# Patient Record
Sex: Female | Born: 1950 | Race: White | Hispanic: No | Marital: Single | Smoking: Current some day smoker
Health system: Southern US, Community
[De-identification: ages and names within clinical notes are randomized; demographics above are authoritative.]

## PROBLEM LIST (undated history)

## (undated) DIAGNOSIS — E119 Type 2 diabetes mellitus without complications: Secondary | ICD-10-CM

## (undated) DIAGNOSIS — I10 Essential (primary) hypertension: Secondary | ICD-10-CM

---

## 1998-01-20 ENCOUNTER — Other Ambulatory Visit: Admission: RE | Admit: 1998-01-20 | Discharge: 1998-01-20 | Payer: Self-pay | Admitting: Obstetrics and Gynecology

## 1999-12-22 ENCOUNTER — Other Ambulatory Visit: Admission: RE | Admit: 1999-12-22 | Discharge: 1999-12-22 | Payer: Self-pay | Admitting: Obstetrics and Gynecology

## 2005-04-27 ENCOUNTER — Emergency Department: Payer: Self-pay | Admitting: Emergency Medicine

## 2018-06-11 ENCOUNTER — Emergency Department (HOSPITAL_COMMUNITY): Payer: Medicare Other

## 2018-06-11 ENCOUNTER — Encounter (HOSPITAL_COMMUNITY): Payer: Self-pay | Admitting: Emergency Medicine

## 2018-06-11 ENCOUNTER — Inpatient Hospital Stay (HOSPITAL_COMMUNITY): Payer: Medicare Other

## 2018-06-11 ENCOUNTER — Encounter (HOSPITAL_COMMUNITY): Admission: EM | Disposition: E | Payer: Self-pay | Source: Home / Self Care | Attending: Neurology

## 2018-06-11 ENCOUNTER — Emergency Department (HOSPITAL_COMMUNITY): Payer: Medicare Other | Admitting: Certified Registered Nurse Anesthetist

## 2018-06-11 ENCOUNTER — Inpatient Hospital Stay (HOSPITAL_COMMUNITY)
Admission: EM | Admit: 2018-06-11 | Discharge: 2018-06-30 | DRG: 023 | Disposition: E | Payer: Medicare Other | Attending: Pulmonary Disease | Admitting: Pulmonary Disease

## 2018-06-11 ENCOUNTER — Other Ambulatory Visit: Payer: Self-pay

## 2018-06-11 DIAGNOSIS — R471 Dysarthria and anarthria: Secondary | ICD-10-CM | POA: Diagnosis present

## 2018-06-11 DIAGNOSIS — R29723 NIHSS score 23: Secondary | ICD-10-CM | POA: Diagnosis not present

## 2018-06-11 DIAGNOSIS — R1310 Dysphagia, unspecified: Secondary | ICD-10-CM | POA: Diagnosis present

## 2018-06-11 DIAGNOSIS — R29719 NIHSS score 19: Secondary | ICD-10-CM | POA: Diagnosis not present

## 2018-06-11 DIAGNOSIS — E1165 Type 2 diabetes mellitus with hyperglycemia: Secondary | ICD-10-CM | POA: Diagnosis present

## 2018-06-11 DIAGNOSIS — E785 Hyperlipidemia, unspecified: Secondary | ICD-10-CM | POA: Diagnosis present

## 2018-06-11 DIAGNOSIS — F17200 Nicotine dependence, unspecified, uncomplicated: Secondary | ICD-10-CM | POA: Diagnosis present

## 2018-06-11 DIAGNOSIS — G8191 Hemiplegia, unspecified affecting right dominant side: Secondary | ICD-10-CM | POA: Diagnosis present

## 2018-06-11 DIAGNOSIS — J9602 Acute respiratory failure with hypercapnia: Secondary | ICD-10-CM | POA: Diagnosis present

## 2018-06-11 DIAGNOSIS — I10 Essential (primary) hypertension: Secondary | ICD-10-CM | POA: Diagnosis present

## 2018-06-11 DIAGNOSIS — Z0189 Encounter for other specified special examinations: Secondary | ICD-10-CM

## 2018-06-11 DIAGNOSIS — Z515 Encounter for palliative care: Secondary | ICD-10-CM | POA: Diagnosis present

## 2018-06-11 DIAGNOSIS — I161 Hypertensive emergency: Secondary | ICD-10-CM | POA: Diagnosis present

## 2018-06-11 DIAGNOSIS — D72829 Elevated white blood cell count, unspecified: Secondary | ICD-10-CM | POA: Diagnosis present

## 2018-06-11 DIAGNOSIS — I639 Cerebral infarction, unspecified: Secondary | ICD-10-CM | POA: Diagnosis not present

## 2018-06-11 DIAGNOSIS — R32 Unspecified urinary incontinence: Secondary | ICD-10-CM | POA: Diagnosis not present

## 2018-06-11 DIAGNOSIS — J9601 Acute respiratory failure with hypoxia: Secondary | ICD-10-CM

## 2018-06-11 DIAGNOSIS — I619 Nontraumatic intracerebral hemorrhage, unspecified: Secondary | ICD-10-CM | POA: Diagnosis not present

## 2018-06-11 DIAGNOSIS — H53461 Homonymous bilateral field defects, right side: Secondary | ICD-10-CM | POA: Diagnosis present

## 2018-06-11 DIAGNOSIS — H5704 Mydriasis: Secondary | ICD-10-CM | POA: Diagnosis present

## 2018-06-11 DIAGNOSIS — R29706 NIHSS score 6: Secondary | ICD-10-CM | POA: Diagnosis present

## 2018-06-11 DIAGNOSIS — R05 Cough: Secondary | ICD-10-CM

## 2018-06-11 DIAGNOSIS — I63532 Cerebral infarction due to unspecified occlusion or stenosis of left posterior cerebral artery: Principal | ICD-10-CM | POA: Diagnosis present

## 2018-06-11 DIAGNOSIS — N179 Acute kidney failure, unspecified: Secondary | ICD-10-CM | POA: Diagnosis present

## 2018-06-11 DIAGNOSIS — R059 Cough, unspecified: Secondary | ICD-10-CM

## 2018-06-11 DIAGNOSIS — R0689 Other abnormalities of breathing: Secondary | ICD-10-CM

## 2018-06-11 HISTORY — PX: IR CT HEAD LTD: IMG2386

## 2018-06-11 HISTORY — PX: IR ANGIO INTRA EXTRACRAN SEL COM CAROTID INNOMINATE BILAT MOD SED: IMG5360

## 2018-06-11 HISTORY — PX: IR INTRA CRAN STENT: IMG2345

## 2018-06-11 HISTORY — DX: Type 2 diabetes mellitus without complications: E11.9

## 2018-06-11 HISTORY — PX: IR ANGIO VERTEBRAL SEL VERTEBRAL UNI R MOD SED: IMG5368

## 2018-06-11 HISTORY — PX: RADIOLOGY WITH ANESTHESIA: SHX6223

## 2018-06-11 HISTORY — PX: IR PERCUTANEOUS ART THROMBECTOMY/INFUSION INTRACRANIAL INC DIAG ANGIO: IMG6087

## 2018-06-11 HISTORY — DX: Essential (primary) hypertension: I10

## 2018-06-11 LAB — APTT: aPTT: 28 seconds (ref 24–36)

## 2018-06-11 LAB — CBC
HCT: 46.1 % — ABNORMAL HIGH (ref 36.0–46.0)
Hemoglobin: 14.4 g/dL (ref 12.0–15.0)
MCH: 28.6 pg (ref 26.0–34.0)
MCHC: 31.2 g/dL (ref 30.0–36.0)
MCV: 91.7 fL (ref 80.0–100.0)
NRBC: 0 % (ref 0.0–0.2)
PLATELETS: 260 10*3/uL (ref 150–400)
RBC: 5.03 MIL/uL (ref 3.87–5.11)
RDW: 12.7 % (ref 11.5–15.5)
WBC: 7.2 10*3/uL (ref 4.0–10.5)

## 2018-06-11 LAB — COMPREHENSIVE METABOLIC PANEL
ALBUMIN: 3.3 g/dL — AB (ref 3.5–5.0)
ALT: 22 U/L (ref 0–44)
ANION GAP: 9 (ref 5–15)
AST: 21 U/L (ref 15–41)
Alkaline Phosphatase: 66 U/L (ref 38–126)
BILIRUBIN TOTAL: 0.6 mg/dL (ref 0.3–1.2)
BUN: 9 mg/dL (ref 8–23)
CHLORIDE: 98 mmol/L (ref 98–111)
CO2: 23 mmol/L (ref 22–32)
Calcium: 8.8 mg/dL — ABNORMAL LOW (ref 8.9–10.3)
Creatinine, Ser: 0.73 mg/dL (ref 0.44–1.00)
GFR calc Af Amer: >60 mL/min (ref >60)
GFR calc non Af Amer: >60 mL/min (ref >60)
GLUCOSE: 293 mg/dL — AB (ref 70–99)
POTASSIUM: 4.3 mmol/L (ref 3.5–5.1)
Sodium: 130 mmol/L — ABNORMAL LOW (ref 135–145)
TOTAL PROTEIN: 6.7 g/dL (ref 6.5–8.1)

## 2018-06-11 LAB — DIFFERENTIAL
ABS IMMATURE GRANULOCYTES: 0.06 10*3/uL (ref 0.00–0.07)
BASOS PCT: 1 %
Basophils Absolute: 0 10*3/uL (ref 0.0–0.1)
EOS ABS: 0.1 10*3/uL (ref 0.0–0.5)
EOS PCT: 2 %
IMMATURE GRANULOCYTES: 1 %
LYMPHS ABS: 1.6 10*3/uL (ref 0.7–4.0)
Lymphocytes Relative: 22 %
Monocytes Absolute: 0.6 10*3/uL (ref 0.1–1.0)
Monocytes Relative: 9 %
NEUTROS PCT: 65 %
Neutro Abs: 4.8 10*3/uL (ref 1.7–7.7)

## 2018-06-11 LAB — I-STAT CHEM 8, ED
BUN: 10 mg/dL (ref 8–23)
CREATININE: 0.6 mg/dL (ref 0.44–1.00)
Calcium, Ion: 1.13 mmol/L — ABNORMAL LOW (ref 1.15–1.40)
Chloride: 98 mmol/L (ref 98–111)
Glucose, Bld: 287 mg/dL — ABNORMAL HIGH (ref 70–99)
HEMATOCRIT: 46 % (ref 36.0–46.0)
Hemoglobin: 15.6 g/dL — ABNORMAL HIGH (ref 12.0–15.0)
POTASSIUM: 4.3 mmol/L (ref 3.5–5.1)
SODIUM: 132 mmol/L — AB (ref 135–145)
TCO2: 24 mmol/L (ref 22–32)

## 2018-06-11 LAB — GLUCOSE, CAPILLARY
GLUCOSE-CAPILLARY: 300 mg/dL — AB (ref 70–99)
GLUCOSE-CAPILLARY: 282 mg/dL — AB (ref 70–99)
Glucose-Capillary: 257 mg/dL — ABNORMAL HIGH (ref 70–99)
Glucose-Capillary: 331 mg/dL — ABNORMAL HIGH (ref 70–99)

## 2018-06-11 LAB — PROTIME-INR
INR: 0.95
PROTHROMBIN TIME: 12.6 s (ref 11.4–15.2)

## 2018-06-11 LAB — I-STAT TROPONIN, ED: Troponin i, poc: 0 ng/mL (ref 0.00–0.08)

## 2018-06-11 LAB — ETHANOL: Alcohol, Ethyl (B): <10 mg/dL (ref <10)

## 2018-06-11 LAB — TRIGLYCERIDES: Triglycerides: 700 mg/dL — ABNORMAL HIGH (ref <150)

## 2018-06-11 LAB — MRSA PCR SCREENING: MRSA BY PCR: NEGATIVE

## 2018-06-11 SURGERY — IR WITH ANESTHESIA
Anesthesia: General

## 2018-06-11 MED ORDER — FENTANYL CITRATE (PF) 100 MCG/2ML IJ SOLN
50.0000 ug | INTRAMUSCULAR | Status: DC | PRN
  Administered 2018-06-11: 50 ug via INTRAVENOUS
  Filled 2018-06-11 (×2): qty 2

## 2018-06-11 MED ORDER — SUCCINYLCHOLINE CHLORIDE 20 MG/ML IJ SOLN
INTRAMUSCULAR | Status: DC | PRN
  Administered 2018-06-11: 200 mg via INTRAVENOUS

## 2018-06-11 MED ORDER — NITROGLYCERIN 1 MG/10 ML FOR IR/CATH LAB
INTRA_ARTERIAL | Status: AC
  Filled 2018-06-11: qty 10

## 2018-06-11 MED ORDER — PHENYLEPHRINE HCL 10 MG/ML IJ SOLN
INTRAMUSCULAR | Status: DC | PRN
  Administered 2018-06-11: 80 ug via INTRAVENOUS
  Administered 2018-06-11: 120 ug via INTRAVENOUS
  Administered 2018-06-11 (×3): 40 ug via INTRAVENOUS
  Administered 2018-06-11: 80 ug via INTRAVENOUS
  Administered 2018-06-11: 40 ug via INTRAVENOUS

## 2018-06-11 MED ORDER — PANTOPRAZOLE SODIUM 40 MG IV SOLR
40.0000 mg | INTRAVENOUS | Status: DC
  Administered 2018-06-11 – 2018-06-12 (×2): 40 mg via INTRAVENOUS
  Filled 2018-06-11 (×2): qty 40

## 2018-06-11 MED ORDER — TICAGRELOR 90 MG PO TABS: 90.0000 mg | ORAL_TABLET | Freq: Two times a day (BID) | ORAL | Status: DC

## 2018-06-11 MED ORDER — CEFAZOLIN SODIUM-DEXTROSE 1-4 GM/50ML-% IV SOLN
INTRAVENOUS | Status: DC | PRN
  Administered 2018-06-11: 2 g via INTRAVENOUS

## 2018-06-11 MED ORDER — PROPOFOL 500 MG/50ML IV EMUL
INTRAVENOUS | Status: DC | PRN
  Administered 2018-06-11: 50 ug/kg/min via INTRAVENOUS

## 2018-06-11 MED ORDER — FENTANYL BOLUS VIA INFUSION
25.0000 ug | INTRAVENOUS | Status: DC | PRN
  Filled 2018-06-11: qty 25

## 2018-06-11 MED ORDER — TICAGRELOR 90 MG PO TABS
ORAL_TABLET | ORAL | Status: AC
  Filled 2018-06-11: qty 2

## 2018-06-11 MED ORDER — FENTANYL CITRATE (PF) 100 MCG/2ML IJ SOLN
50.0000 ug | INTRAMUSCULAR | Status: DC | PRN
  Administered 2018-06-11: 50 ug via INTRAVENOUS
  Filled 2018-06-11: qty 2

## 2018-06-11 MED ORDER — ROCURONIUM BROMIDE 50 MG/5ML IV SOSY
PREFILLED_SYRINGE | INTRAVENOUS | Status: DC | PRN
  Administered 2018-06-11: 50 mg via INTRAVENOUS
  Administered 2018-06-11 (×2): 10 mg via INTRAVENOUS
  Administered 2018-06-11: 20 mg via INTRAVENOUS
  Administered 2018-06-11: 30 mg via INTRAVENOUS
  Administered 2018-06-11: 50 mg via INTRAVENOUS

## 2018-06-11 MED ORDER — SODIUM CHLORIDE 0.9 % IV SOLN
INTRAVENOUS | Status: DC
  Administered 2018-06-11 – 2018-06-13 (×3): via INTRAVENOUS

## 2018-06-11 MED ORDER — CLEVIDIPINE BUTYRATE 0.5 MG/ML IV EMUL
INTRAVENOUS | Status: AC
  Filled 2018-06-11: qty 50

## 2018-06-11 MED ORDER — SUGAMMADEX SODIUM 200 MG/2ML IV SOLN
INTRAVENOUS | Status: DC | PRN
  Administered 2018-06-11: 300 mg via INTRAVENOUS

## 2018-06-11 MED ORDER — INSULIN ASPART 100 UNIT/ML ~~LOC~~ SOLN
SUBCUTANEOUS | Status: AC
  Filled 2018-06-11: qty 1

## 2018-06-11 MED ORDER — STROKE: EARLY STAGES OF RECOVERY BOOK
Freq: Once | Status: DC
  Filled 2018-06-11 (×2): qty 1

## 2018-06-11 MED ORDER — SODIUM CHLORIDE 0.9 % IV SOLN
INTRAVENOUS | Status: DC
  Administered 2018-06-11 (×3): via INTRAVENOUS

## 2018-06-11 MED ORDER — CLEVIDIPINE BUTYRATE 0.5 MG/ML IV EMUL
0.0000 mg/h | INTRAVENOUS | Status: DC
  Administered 2018-06-11: 19 mg/h via INTRAVENOUS
  Administered 2018-06-11: 21 mg/h via INTRAVENOUS
  Filled 2018-06-11 (×2): qty 50

## 2018-06-11 MED ORDER — ACETAMINOPHEN 160 MG/5ML PO SOLN: 650.0000 mg | ORAL | Status: DC | PRN

## 2018-06-11 MED ORDER — PROPOFOL 1000 MG/100ML IV EMUL
5.0000 ug/kg/min | INTRAVENOUS | Status: DC
  Administered 2018-06-11 (×2): 50 ug/kg/min via INTRAVENOUS
  Filled 2018-06-11 (×2): qty 100

## 2018-06-11 MED ORDER — TICAGRELOR 90 MG PO TABS
90.0000 mg | ORAL_TABLET | Freq: Two times a day (BID) | ORAL | Status: DC
  Administered 2018-06-12 (×2): 90 mg
  Filled 2018-06-11 (×2): qty 1

## 2018-06-11 MED ORDER — MIDAZOLAM HCL 2 MG/2ML IJ SOLN
INTRAMUSCULAR | Status: AC
  Filled 2018-06-11: qty 2

## 2018-06-11 MED ORDER — ONDANSETRON HCL 4 MG/2ML IJ SOLN: 4.0000 mg | Freq: Four times a day (QID) | INTRAMUSCULAR | Status: DC | PRN

## 2018-06-11 MED ORDER — CHLORHEXIDINE GLUCONATE 0.12% ORAL RINSE (MEDLINE KIT)
15.0000 mL | Freq: Two times a day (BID) | OROMUCOSAL | Status: DC
  Administered 2018-06-11 – 2018-06-13 (×4): 15 mL via OROMUCOSAL

## 2018-06-11 MED ORDER — LIDOCAINE HCL 1 % IJ SOLN
INTRAMUSCULAR | Status: AC
  Filled 2018-06-11: qty 20

## 2018-06-11 MED ORDER — PROPOFOL 10 MG/ML IV BOLUS
INTRAVENOUS | Status: DC | PRN
  Administered 2018-06-11: 200 mg via INTRAVENOUS
  Administered 2018-06-11: 100 mg via INTRAVENOUS

## 2018-06-11 MED ORDER — FENTANYL CITRATE (PF) 100 MCG/2ML IJ SOLN: 50.0000 ug | INTRAMUSCULAR | Status: DC | PRN

## 2018-06-11 MED ORDER — CEFAZOLIN SODIUM-DEXTROSE 2-4 GM/100ML-% IV SOLN
INTRAVENOUS | Status: AC
  Filled 2018-06-11: qty 100

## 2018-06-11 MED ORDER — DEXMEDETOMIDINE HCL IN NACL 400 MCG/100ML IV SOLN
0.4000 ug/kg/h | INTRAVENOUS | Status: DC
  Administered 2018-06-11 – 2018-06-12 (×2): 0.5 ug/kg/h via INTRAVENOUS
  Administered 2018-06-13: 0.6 ug/kg/h via INTRAVENOUS
  Filled 2018-06-11 (×3): qty 100

## 2018-06-11 MED ORDER — ONDANSETRON HCL 4 MG/2ML IJ SOLN
INTRAMUSCULAR | Status: DC | PRN
  Administered 2018-06-11: 4 mg via INTRAVENOUS

## 2018-06-11 MED ORDER — FENTANYL CITRATE (PF) 100 MCG/2ML IJ SOLN
INTRAMUSCULAR | Status: AC
  Filled 2018-06-11: qty 2

## 2018-06-11 MED ORDER — IOPAMIDOL (ISOVUE-370) INJECTION 76%
50.0000 mL | Freq: Once | INTRAVENOUS | Status: AC | PRN
  Administered 2018-06-11: 50 mL via INTRAVENOUS

## 2018-06-11 MED ORDER — EPTIFIBATIDE 20 MG/10ML IV SOLN
INTRAVENOUS | Status: AC
  Filled 2018-06-11: qty 10

## 2018-06-11 MED ORDER — TICAGRELOR 60 MG PO TABS
ORAL_TABLET | ORAL | Status: AC | PRN
  Administered 2018-06-11: 180 mg

## 2018-06-11 MED ORDER — ASPIRIN 81 MG PO CHEW
81.0000 mg | CHEWABLE_TABLET | Freq: Every day | ORAL | Status: DC
  Administered 2018-06-12: 81 mg
  Filled 2018-06-11: qty 1

## 2018-06-11 MED ORDER — ORAL CARE MOUTH RINSE
15.0000 mL | OROMUCOSAL | Status: DC
  Administered 2018-06-11 – 2018-06-13 (×15): 15 mL via OROMUCOSAL

## 2018-06-11 MED ORDER — ACETAMINOPHEN 325 MG PO TABS: 650.0000 mg | ORAL_TABLET | ORAL | Status: DC | PRN

## 2018-06-11 MED ORDER — LIDOCAINE HCL (CARDIAC) PF 100 MG/5ML IV SOSY
PREFILLED_SYRINGE | INTRAVENOUS | Status: DC | PRN
  Administered 2018-06-11: 50 mg via INTRAVENOUS

## 2018-06-11 MED ORDER — NITROGLYCERIN 1 MG/10 ML FOR IR/CATH LAB
INTRA_ARTERIAL | Status: AC | PRN
  Administered 2018-06-11 (×7): 25 ug via INTRA_ARTERIAL

## 2018-06-11 MED ORDER — SENNOSIDES-DOCUSATE SODIUM 8.6-50 MG PO TABS: 1.0000 | ORAL_TABLET | Freq: Every evening | ORAL | Status: DC | PRN

## 2018-06-11 MED ORDER — TIROFIBAN HCL IN NACL 5-0.9 MG/100ML-% IV SOLN
INTRAVENOUS | Status: AC
  Filled 2018-06-11: qty 100

## 2018-06-11 MED ORDER — CLEVIDIPINE BUTYRATE 0.5 MG/ML IV EMUL
0.0000 mg/h | INTRAVENOUS | Status: DC
  Administered 2018-06-11: 10 mg/h via INTRAVENOUS
  Administered 2018-06-11: 21 mg/h via INTRAVENOUS
  Filled 2018-06-11: qty 50

## 2018-06-11 MED ORDER — ASPIRIN 81 MG PO CHEW: 81.0000 mg | CHEWABLE_TABLET | Freq: Every day | ORAL | Status: DC

## 2018-06-11 MED ORDER — INSULIN ASPART 100 UNIT/ML ~~LOC~~ SOLN: 8.0000 [IU] | SUBCUTANEOUS | Status: AC

## 2018-06-11 MED ORDER — ASPIRIN 81 MG PO CHEW
CHEWABLE_TABLET | ORAL | Status: AC
  Filled 2018-06-11: qty 1

## 2018-06-11 MED ORDER — INSULIN ASPART 100 UNIT/ML ~~LOC~~ SOLN
0.0000 [IU] | SUBCUTANEOUS | Status: DC
  Administered 2018-06-11 – 2018-06-12 (×2): 8 [IU] via SUBCUTANEOUS
  Administered 2018-06-12: 5 [IU] via SUBCUTANEOUS

## 2018-06-11 MED ORDER — DEXMEDETOMIDINE HCL IN NACL 200 MCG/50ML IV SOLN
0.4000 ug/kg/h | INTRAVENOUS | Status: DC
  Administered 2018-06-11: 0.5 ug/kg/h via INTRAVENOUS
  Filled 2018-06-11: qty 50

## 2018-06-11 MED ORDER — EPTIFIBATIDE 20 MG/10ML IV SOLN
INTRAVENOUS | Status: AC | PRN
  Administered 2018-06-11 (×2): 1.5 mg
  Administered 2018-06-11: 1.5 ug
  Administered 2018-06-11 (×3): 1.5 mg

## 2018-06-11 MED ORDER — ASPIRIN 325 MG PO TABS
ORAL_TABLET | ORAL | Status: AC
  Filled 2018-06-11: qty 1

## 2018-06-11 MED ORDER — CLOPIDOGREL BISULFATE 300 MG PO TABS
ORAL_TABLET | ORAL | Status: AC
  Filled 2018-06-11: qty 1

## 2018-06-11 MED ORDER — FENTANYL CITRATE (PF) 100 MCG/2ML IJ SOLN
50.0000 ug | Freq: Once | INTRAMUSCULAR | Status: AC
  Administered 2018-06-11: 50 ug via INTRAVENOUS

## 2018-06-11 MED ORDER — CLEVIDIPINE BUTYRATE 0.5 MG/ML IV EMUL
INTRAVENOUS | Status: DC | PRN
  Administered 2018-06-11: 4 mg/h via INTRAVENOUS

## 2018-06-11 MED ORDER — NICARDIPINE HCL IN NACL 40-0.83 MG/200ML-% IV SOLN
3.0000 mg/h | INTRAVENOUS | Status: DC
  Administered 2018-06-11: 5 mg/h via INTRAVENOUS
  Administered 2018-06-12: 3 mg/h via INTRAVENOUS
  Administered 2018-06-12: 6 mg/h via INTRAVENOUS
  Administered 2018-06-13: 3 mg/h via INTRAVENOUS
  Filled 2018-06-11 (×4): qty 200

## 2018-06-11 MED ORDER — FENTANYL CITRATE (PF) 100 MCG/2ML IJ SOLN
INTRAMUSCULAR | Status: DC | PRN
  Administered 2018-06-11: 25 ug via INTRAVENOUS
  Administered 2018-06-11 (×2): 50 ug via INTRAVENOUS

## 2018-06-11 MED ORDER — INSULIN ASPART 100 UNIT/ML ~~LOC~~ SOLN
0.0000 [IU] | Freq: Three times a day (TID) | SUBCUTANEOUS | Status: DC
  Administered 2018-06-11: 2 [IU] via SUBCUTANEOUS
  Administered 2018-06-11: 11 [IU] via SUBCUTANEOUS

## 2018-06-11 MED ORDER — ACETAMINOPHEN 650 MG RE SUPP: 650.0000 mg | RECTAL | Status: DC | PRN

## 2018-06-11 MED ORDER — SODIUM CHLORIDE 0.9 % IV SOLN
INTRAVENOUS | Status: DC | PRN
  Administered 2018-06-11: 20 ug/min via INTRAVENOUS

## 2018-06-11 MED ORDER — NITROGLYCERIN 0.2 MG/ML ON CALL CATH LAB
INTRAVENOUS | Status: DC | PRN
  Administered 2018-06-11: 80 ug via INTRAVENOUS
  Administered 2018-06-11: 20 ug via INTRAVENOUS
  Administered 2018-06-11: 40 ug via INTRAVENOUS
  Administered 2018-06-11: 120 ug via INTRAVENOUS

## 2018-06-11 MED ORDER — ASPIRIN 325 MG PO TABS
ORAL_TABLET | ORAL | Status: AC | PRN
  Administered 2018-06-11: 81 mg via ORAL

## 2018-06-11 MED ORDER — IOHEXOL 300 MG/ML  SOLN: 300.0000 mL | Freq: Once | INTRAMUSCULAR | Status: DC | PRN

## 2018-06-11 MED ORDER — LIDOCAINE 2% (20 MG/ML) 5 ML SYRINGE
INTRAMUSCULAR | Status: DC | PRN
  Administered 2018-06-11: 80 mg via INTRAVENOUS

## 2018-06-11 MED ORDER — FENTANYL 2500MCG IN NS 250ML (10MCG/ML) PREMIX INFUSION
25.0000 ug/h | INTRAVENOUS | Status: DC
  Administered 2018-06-11: 100 ug/h via INTRAVENOUS
  Administered 2018-06-13: 200 ug/h via INTRAVENOUS
  Filled 2018-06-11 (×2): qty 250

## 2018-06-11 NOTE — Progress Notes (Signed)
Patient ID: Jessica Hoffman, female   DOB: Jul 26, 1951, 67 y.o.   MRN: [id number] Post procedure patients GA reversed but not extubated. Breathing on herself. Rt pupil 2mm . Lt 4mm both sluggisly reactive. RT groin soft. No hematoma. Distal pulses palpable DPS and PTs bilaterally. Patient to CT brain following D/W neurologist. S.Klever Twyford MD

## 2018-06-11 NOTE — Code Documentation (Signed)
67 yo Caucasian female coming from home with complaints of waking up to right sided weakness and some change in her vision. Pt called EMS and was brought into the ED. EDP and ED RN evaluated patient and activated a Code Stroke. Stroke Team met patient in CT. Initial NIHSS 6 due to right visual field cut, right arm and leg ataxia, decreased sensation of the right, and some mild inattention to the left arm. CT head negative for hemorrhage. CTA/CTP completed and showed an occlusion to her left PCA. Perfusion was noted to have 36 ml of penumbra with no core. Neurologist spoke with [employer] and together they decided to let patient decide whether to move forward with the procedure. MD Rory Percy spoke with the patient and explained the procedure. Patient understood risks and benefits. Decided to continue on with treatment. IR notified and pt taken directly to IR. Arrived in IR at 1129 with pulses marked and completely undressed in hospital gown. Handoff given to [place], Therapist, sports.

## 2018-06-11 NOTE — Progress Notes (Signed)
PCCM INTERVAL EVENT  Triglycerides: 700. Drawn from arterial line. Patient on propofol and clevidipine.  Plan: DC propofol, start precedex infusion and PRN fentanyl.  Transition from clevidipine to nicardipine.   Joneen Roach, AGACNP-BC [facility] Pulmonary/Critical Care Pager [phone] or [phone]  2018-06-12 9:55 PM

## 2018-06-11 NOTE — Procedures (Signed)
S/P bilateral vert artery angiograms and Lt common carotid arteriogram followed by partial recanalization of occluded Lt PCA prox with x 1 pass with 33mm embotrap retriever device achieving a TICI 2b revascularization followed by rescue stenting of reocclusion of the LT PCA  Due to underlying tandem atherosclerotic stenosis . Occlusion within the stent partially recanalized with 9mm of superselective  IA integrelin and x1 balloon angioplasty.

## 2018-06-11 NOTE — Progress Notes (Addendum)
Pt arrived to unit around 1600.  Bedside report given by CRNA.  CCM and neurology paged regarding orders.  New orders received.  RN will continue to monitor.    CCM paged regarding sedation med not working.  New orders received.

## 2018-06-11 NOTE — Anesthesia Procedure Notes (Addendum)
Arterial Line Insertion Start/End08/14/19 11:50 AM Performed by: Dorie RankQuinn, Holly M, CRNA, CRNA  Patient location: OOR procedure area. Preanesthetic checklist: patient identified, IV checked, site marked, risks and benefits discussed, surgical consent, monitors and equipment checked and pre-op evaluation Left, radial was placed Catheter size: 20 G Hand hygiene performed  and maximum sterile barriers used   Attempts: 1 Procedure performed without using ultrasound guided technique. Following insertion, dressing applied and Biopatch. Post procedure assessment: normal  Patient tolerated the procedure well with no immediate complications.

## 2018-06-11 NOTE — Progress Notes (Signed)
Patient ID: Alvina ChouMelinda Parlow, female   DOB: 1951/01/07, 67 y.o.   MRN: [id number] INR. [age] y F Rh  LSW  10pm last night. New symptoms of  And RT sided weakness. And RT momonymous hemianopsia. mRSS of 1 to 2 . CT Brain  NO ICH  CTA occluded Lt PCA prox. CTP penumbra of 36 ml with no core vol  Entire Lt PCA distribution involved. ASPECTS 9 Option  of endovascular revascularization of occluded PCA D/W patient .reasons,risks,alternatives reviewed. Risks of ICH of 10 %,worsening neuro function,death ,vent dependency,vascular injury and inability to revascularize reviewed. Questions answered to patients satisfaction. Informed witnessed consent obtained. S,Abigal Choung MD

## 2018-06-11 NOTE — Progress Notes (Signed)
Post IR CT - no bleed. Stent placed mid basilar to P2 stent. Left pupil 5mm, non reactive, Rt 3mm sluggish Withdrawal to pain all except Rt arm.  Remains intubated.  Will follow.  -- Milon DikesAshish Kylei Purington, MD [facility] Pager: [phone] If 7pm to 7am, please call on call as listed on AMION.

## 2018-06-11 NOTE — Consult Note (Signed)
NAME:  Xayla Puzio, MRN:  [id number], DOB:  Sep 17, 1950, LOS: 0 ADMISSION DATE:  06-27-18, CONSULTATION DATE:  Jun 27, 2018 REFERRING MD:  Corliss Skains  CHIEF COMPLAINT:  AMS   Brief History   Matricia Begnaud is a 67 y.o. female who was admitted 11/12 with left PCA infarct.  Taken to IR for intervention then returned to the ICU on the ventilator.  History of present illness   Tatiana Courter is a 67 y.o. female who has a PMH as outlined below (see "past medical history").  She presented to [facility] ED 11/12 with right sided numbness.  She was last seen normal at 10pm the night prior before going to bed.  In ED, she was hypertensive at 230/120.  She had right sided deficits; therefore, code stroke was activated.  She had CT that showed early changes c/w acute / subacute left PCA area infarct.  CTA demonstrated left P1 segment occlusion.  CT perfusion did not show any core infarct; therefore, she was taken to IR where she had b/l verterbral artery and L common carotid artery angiograms followed by partial recanalization of occluded left PCA and rescue stenting of reocclusion to the left PCA.  Past Medical History  HTN, DM.  Significant Hospital Events   11/12 > admit.  Consults:  PCCM 11/12  Procedures:  ETT 11/12 >  IR 11/12 > b/l verterbral artery and L common carotid artery angiograms followed by partial recanalization of occluded left PCA and rescue stenting of reocclusion to the left PCA.  Significant Diagnostic Tests:  CT head 11/12 > left PCA infarct. CTA head / CT cerebral perfusion 11/12 > left PCA occlusion, occlusion or high grade stenosis of right vertebral artery, mild to mod right PCA atherosclerosis and stenosis, b/l carotid atherosclerosis. MRI brain 11/13 >  Echo 11/13 >   Micro Data:  None.  Antimicrobials:  None.   Interim history/subjective:  On vent, comfortable.  Objective:  Blood pressure (!) 155/60, pulse 94, temperature 97.7 F (36.5 C), temperature source Oral,  resp. rate (!) 22, height 5\' 7"  (1.702 m), weight 122.5 kg, SpO2 90 %.    Vent Mode: PRVC FiO2 (%):  [40 %] 40 % Set Rate:  [14 bmp] 14 bmp Vt Set:  [490 mL] 490 mL PEEP:  [5 cmH20] 5 cmH20 Plateau Pressure:  [21 cmH20] 21 cmH20   Intake/Output Summary (Last 24 hours) at June 27, 2018 1705 Last data filed at 2018/06/27 1517 Gross per 24 hour  Intake 2000 ml  Output 25 ml  Net 1975 ml   Filed Weights   06/27/2018 1031  Weight: 122.5 kg    Examination: General: Adult female, in NAD. Neuro: Sedated, unresponsive. HEENT: [place]/AT. Sclerae anicteric.  Left pupil dilated > right and sluggish. Cardiovascular: RRR, no M/R/G.  Lungs: Respirations even and unlabored.  CTA bilaterally, No W/R/R.  Abdomen: Obese, BS x 4, soft, NT/ND.  Musculoskeletal: No gross deformities, no edema.  Skin: Intact, warm, no rashes.  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 - Post op care per IR. - Stroke workup / management per neuro.  Respiratory insufficiency - due to above. - Continue vent support overnight. - SBT in AM for hopeful extubation.  Hx HTN. - Continue cleviprex, goal SBP < 140 with acute infarct. - May need to have a regimen started for long term management along with PCP follow up.  Hx DM. - SSI.  Rest per primary team.   Best Practice:  Diet: NPO. Pain/Anxiety/Delirium protocol (if  indicated): Precedex gtt / fentanyl PRN.  RASS goal 0 to -1. VAP protocol (if indicated): In place. DVT prophylaxis: SCD's. GI prophylaxis: Pantoprazole. Glucose control: SSI. Mobility: Bedrest. Code Status: Full. Family Communication: None available. Disposition: ICU.  Labs   CBC: Recent Labs  Lab 10-09-17 1045 10-09-17 1047  WBC 7.2  --   NEUTROABS 4.8  --   HGB 14.4 15.6*  HCT 46.1* 46.0  MCV 91.7  --   PLT 260  --    Basic Metabolic Panel: Recent Labs  Lab 10-09-17 1045 10-09-17 1047  NA 130* 132*  K 4.3 4.3  CL 98 98  CO2 23  --   GLUCOSE 293* 287*    BUN 9 10  CREATININE 0.73 0.60  CALCIUM 8.8*  --    GFR: Estimated Creatinine Clearance: 90.1 mL/min (by C-G formula based on SCr of 0.6 mg/dL). Recent Labs  Lab 10-09-17 1045  WBC 7.2   Liver Function Tests: Recent Labs  Lab 10-09-17 1045  AST 21  ALT 22  ALKPHOS 66  BILITOT 0.6  PROT 6.7  ALBUMIN 3.3*   No results for input(s): LIPASE, AMYLASE in the last 168 hours. No results for input(s): AMMONIA in the last 168 hours. ABG    Component Value Date/Time   TCO2 24 2017-11-25 1047    Coagulation Profile: Recent Labs  Lab 10-09-17 1045  INR 0.95   Cardiac Enzymes: No results for input(s): CKTOTAL, CKMB, CKMBINDEX, TROPONINI in the last 168 hours. HbA1C: No results found for: HGBA1C CBG: No results for input(s): GLUCAP in the last 168 hours.  Review of Systems:   Unable to obtain as pt is encephalopathic.  Past medical history  She,  has a past medical history of Diabetes mellitus without complication (HCC) and Hypertension.   Surgical History   History reviewed. No pertinent surgical history.   Social History   reports that she has been smoking. She has never used smokeless tobacco. She reports that she drank alcohol. She reports that she does not use drugs.   Family history   Her family history is not on file.   Allergies No Known Allergies   Home meds  Prior to Admission medications   Not on File    Critical care time: 35 min.    Rutherford Guysahul Arbutus Nelligan, [place] - [facility] [place] [facility] Pager: [phone]  or [phone] 2017-11-25, 5:05 PM

## 2018-06-11 NOTE — Anesthesia Postprocedure Evaluation (Signed)
Anesthesia Post Note  Patient: Jessica Hoffman  Procedure(Hoffman) Performed: IR WITH ANESTHESIA (N/A )     Patient location during evaluation: NICU Anesthesia Type: General Level of consciousness: sedated Pain management: pain level controlled Vital Signs Assessment: post-procedure vital signs reviewed and stable Respiratory status: patient remains intubated per anesthesia plan Cardiovascular status: stable Postop Assessment: no apparent nausea or vomiting Anesthetic complications: no    Last Vitals:  Vitals:   06/14/2018 1031 06/02/2018 1550  BP: (!) 230/88   Pulse: 68   Resp: 15   Temp: 36.5 C   SpO2: 100% 100%    Last Pain:  Vitals:   06/26/2018 1031  TempSrc: Oral  PainSc: 0-No pain                 Jessica Hoffman

## 2018-06-11 NOTE — ED Triage Notes (Signed)
Pt in from home via [facility] with R side numbness since 10pm last night. Speech clear, vision is more blurry R side. R arm drift present. Fire dept reports initial BP of 280/120, CBG 310. A&ox4

## 2018-06-11 NOTE — Transfer of Care (Signed)
Immediate Anesthesia Transfer of Care Note  Patient: Jessica ChouMelinda Hoffman  Procedure(s) Performed: IR WITH ANESTHESIA (N/A )  Patient Location: NICU  Anesthesia Type:General  Level of Consciousness: Patient remains intubated per anesthesia plan  Airway & Oxygen Therapy: Patient remains intubated per anesthesia plan and Patient placed on Ventilator (see vital sign flow sheet for setting)  Post-op Assessment: Report given to RN and Post -op Vital signs reviewed and stable  Post vital signs: Reviewed and stable  Last Vitals:  Vitals Value Taken Time  BP 155/60 06/26/2018  4:00 PM  Temp    Pulse 91 06/10/2018  4:05 PM  Resp 19 06/07/2018  4:05 PM  SpO2 91 % 06/06/2018  4:05 PM  Vitals shown include unvalidated device data.  Last Pain:  Vitals:   06/07/2018 1031  TempSrc: Oral  PainSc: 0-No pain         Complications: No apparent anesthesia complications

## 2018-06-11 NOTE — ED Provider Notes (Signed)
[facility] EMERGENCY DEPARTMENT Provider Note   CSN: [id number] Arrival date & time: 06/14/18  1020   An emergency department physician performed an initial assessment on this suspected stroke patient at 1020.  History   Chief Complaint Chief Complaint  Patient presents with  . R side numbness  . Hypertension    HPI Jessica Hoffman is a 67 y.o. female.  The history is provided by the patient and the EMS personnel. No language interpreter was used.  Hypertension    Jessica Hoffman is a 67 y.o. female who presents to the Emergency Department complaining of right sided numbness. He has a history of hypertension and diabetes and presents to the emergency department by EMS for evaluation of right sided numbness. Symptoms started after she went to bed at night. She went to bed between 10 and 11 and when she awoke in the middle the night she noted that she was numb to the right face, arm and leg. When she woke this morning she has ongoing numbness and was concerned for stroke. She took five baby aspirin and called 911. No prior similar symptoms. She denies any recent illnesses or medication changes. No prior similar symptoms. Past Medical History:  Diagnosis Date  . Diabetes mellitus without complication (HCC)   . Hypertension     There are no active problems to display for this patient.   History reviewed. No pertinent surgical history.   OB History   None      Home Medications    Prior to Admission medications   Not on File    Family History No family history on file.  Social History Social History   Tobacco Use  . Smoking status: Current Some Day Smoker  . Smokeless tobacco: Never Used  Substance Use Topics  . Alcohol use: Not Currently  . Drug use: Never     Allergies   Patient has no allergy information on record.   Review of Systems Review of Systems  All other systems reviewed and are negative.    Physical Exam Updated Vital  Signs BP (!) 230/88 (BP Location: Right Arm)   Pulse 68   Temp 97.7 F (36.5 C) (Oral)   Resp 15   Ht 5\' 5"  (1.651 m)   Wt 122.5 kg   SpO2 100%   BMI 44.93 kg/m   Physical Exam  Constitutional: She is oriented to person, place, and time. She appears well-developed and well-nourished.  HENT:  Head: Normocephalic and atraumatic.  Cardiovascular: Normal rate and regular rhythm.  No murmur heard. Pulmonary/Chest: Effort normal and breath sounds normal. No respiratory distress.  Abdominal: Soft. There is no tenderness. There is no rebound and no guarding.  Musculoskeletal: She exhibits no tenderness.  2+ DP pulses bilaterally. Non pitting edema to bilateral lower extremities.  Neurological: She is alert and oriented to person, place, and time.  No asymmetry of facial muscles. Altered sensation to light touch of her right face, arm and leg. Fluent speech. Right-sided visual field cuts. Right upper extremity drift. 4/5 strength in the right upper extremity, 4/5 strength in the right lower extremity.  Skin: Skin is warm and dry.  Psychiatric: She has a normal mood and affect. Her behavior is normal.  Nursing note and vitals reviewed.    ED Treatments / Results  Labs (all labs ordered are listed, but only abnormal results are displayed) Labs Reviewed  CBC - Abnormal; Notable for the following components:      Result Value  HCT 46.1 (*)    All other components within normal limits  I-STAT CHEM 8, ED - Abnormal; Notable for the following components:   Sodium 132 (*)    Glucose, Bld 287 (*)    Calcium, Ion 1.13 (*)    Hemoglobin 15.6 (*)    All other components within normal limits  PROTIME-INR  APTT  DIFFERENTIAL  ETHANOL  COMPREHENSIVE METABOLIC PANEL  RAPID URINE DRUG SCREEN, HOSP PERFORMED  URINALYSIS, ROUTINE W REFLEX MICROSCOPIC  I-STAT TROPONIN, ED    EKG EKG Interpretation  Date/Time:  Tuesday June 11 2018 10:28:16 EST Ventricular Rate:  68 PR Interval:      QRS Duration: 92 QT Interval:  426 QTC Calculation: 454 R Axis:   -16 Text Interpretation:  Sinus rhythm Borderline left axis deviation Borderline T wave abnormalities Confirmed by Tilden Fossaees, Delayni Streed [phone]([id number]) on August 16, 2017 11:17:54 AM   Radiology Ct Head Code Stroke Wo Contrast  Result Date: August 16, 2017 CLINICAL DATA:  Code stroke. 67 year old female with right side weakness. EXAM: CT HEAD WITHOUT CONTRAST TECHNIQUE: Contiguous axial images were obtained from the base of the skull through the vertex without intravenous contrast. COMPARISON:  None. FINDINGS: Brain: Frontal lobe volume loss bilaterally (coronal image 30). Lacunar infarcts in the left corona radiata and right caudate appear chronic. Age indeterminate left lateral thalamus lacune (series 3, image 18). Additionally there is a 3 centimeter area of cytotoxic edema in the left occipital lobe (also on image 18). No associated hemorrhage or mass effect. No other cytotoxic edema identified. No midline shift, ventriculomegaly, mass effect, evidence of mass lesion, intracranial hemorrhage. No chronic cortical encephalomalacia identified. Vascular: Calcified atherosclerosis at the skull base. Skull: Negative, hyperostosis (normal variant). Sinuses/Orbits: Visualized paranasal sinuses and mastoids are well pneumatized. Other: Visualized orbit soft tissues are within normal limits. No acute scalp soft tissue findings. ASPECTS [facility]([place] Stroke Program Early CT Score) - Ganglionic level infarction (caudate, lentiform nuclei, internal capsule, insula, M1-M3 cortex): 7 - Supraganglionic infarction (M4-M6 cortex): 3 Total score (0-10 with 10 being normal): 10 (left PCA territory abnormalities). IMPRESSION: 1. Acute to subacute appearing left PCA territory infarct with probable involvement of the left thalamus in this clinical setting. No associated hemorrhage or mass effect. 2. 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  3. The nonspecific bilateral frontal lobe atrophy. 4. ASPECTS is 10. 5. These results were communicated to Dr. Wilford CornerArora at 10:57 amon January 17, 2019by text page via the [facility] messaging system. Electronically Signed   By: Odessa FlemingH  Hall M.D.   On: August 16, 2017 10:58    Procedures Procedures (including critical care time) CRITICAL CARE Performed by: Tilden FossaElizabeth Josette Shimabukuro   Total critical care time: 35 minutes  Critical care time was exclusive of separately billable procedures and treating other patients.  Critical care was necessary to treat or prevent imminent or life-threatening deterioration.  Critical care was time spent personally by me on the following activities: development of treatment plan with patient and/or surrogate as well as nursing, discussions with consultants, evaluation of patient's response to treatment, examination of patient, obtaining history from patient or surrogate, ordering and performing treatments and interventions, ordering and review of laboratory studies, ordering and review of radiographic studies, pulse oximetry and re-evaluation of patient's condition. Medications Ordered in ED Medications  iopamidol (ISOVUE-370) 76 % injection 50 mL (50 mLs Intravenous Contrast Given 06/10/18 1109)     Initial Impression / Assessment and Plan / ED Course  I have reviewed the triage vital signs and the  nursing notes.  Pertinent labs & imaging results that were available during my care of the patient were reviewed by me and considered in my medical decision making (see chart for details).     Patient here for evaluation of right arm and leg numbness that began late last night/ early this morning. Patient is LVL positive with right-sided visual field cut as well as right upper extremity drift and altered sensation. Code stroke was activated on ED arrival. She was evaluated promptly by neurology. CTA is positive for acute occlusion, patient transferred to interventional radiology for emergent  intervention. She is not a TPA candidate due to duration of symptoms. Final Clinical Impressions(s) / ED Diagnoses   Final diagnoses:  Acute CVA (cerebrovascular accident) [facility])    ED Discharge Orders    None       Tilden Fossa, MD 07-02-2018 [phone]

## 2018-06-11 NOTE — Anesthesia Procedure Notes (Signed)
Procedure Name: Intubation Date/Time: 06/15/2018 11:45 AM Performed by: Lavell Luster, CRNA Pre-anesthesia Checklist: Patient identified, Emergency Drugs available, Suction available, Patient being monitored and Timeout performed Patient Re-evaluated:Patient Re-evaluated prior to induction Oxygen Delivery Method: Circle system utilized Preoxygenation: Pre-oxygenation with 100% oxygen Induction Type: IV induction Ventilation: Mask ventilation without difficulty and Oral airway inserted - appropriate to patient size Laryngoscope Size: Mac and 3 Grade View: Grade III Tube type: Subglottic suction tube Laser Tube: Cuffed inflated with minimal occlusive pressure - saline Tube size: 7.5 mm Number of attempts: 2 Airway Equipment and Method: Stylet and Video-laryngoscopy Placement Confirmation: ETT inserted through vocal cords under direct vision,  positive ETCO2 and breath sounds checked- equal and bilateral Secured at: 22 cm Tube secured with: Tape Dental Injury: Teeth and Oropharynx as per pre-operative assessment  Difficulty Due To: Difficulty was unanticipated, Difficult Airway- due to limited oral opening and Difficult Airway- due to reduced neck mobility Future Recommendations: Recommend- induction with short-acting agent, and alternative techniques readily available

## 2018-06-11 NOTE — Anesthesia Preprocedure Evaluation (Addendum)
Anesthesia Evaluation  Patient identified by MRN, date of birth, ID band Patient confused    Reviewed: Allergy & Precautions, NPO status , Patient's Chart, lab work & pertinent test resultsPreop documentation limited or incomplete due to emergent nature of procedure.  Airway Mallampati: II  TM Distance: >3 FB Neck ROM: Full    Dental no notable dental hx. (+) Poor Dentition   Pulmonary Current Smoker,    Pulmonary exam normal breath sounds clear to auscultation       Cardiovascular hypertension, Normal cardiovascular exam Rhythm:Regular Rate:Normal     Neuro/Psych CVA negative psych ROS   GI/Hepatic negative GI ROS, Neg liver ROS,   Endo/Other  diabetes, Type 2  Renal/GU negative Renal ROS     Musculoskeletal negative musculoskeletal ROS (+)   Abdominal   Peds  Hematology negative hematology ROS (+)   Anesthesia Other Findings Code stroke, pt following commands but confused  Reproductive/Obstetrics                            Anesthesia Physical Anesthesia Plan  ASA: III and emergent  Anesthesia Plan: General   Post-op Pain Management:    Induction: Intravenous, Cricoid pressure planned and Rapid sequence  PONV Risk Score and Plan: 2 and Treatment may vary due to age or medical condition  Airway Management Planned: Oral ETT  Additional Equipment: Arterial line  Intra-op Plan:   Post-operative Plan: Extubation in OR  Informed Consent: I have reviewed the patients History and Physical, chart, labs and discussed the procedure including the risks, benefits and alternatives for the proposed anesthesia with the patient or authorized representative who has indicated his/her understanding and acceptance.   Dental advisory given  Plan Discussed with: CRNA  Anesthesia Plan Comments:         Anesthesia Quick Evaluation

## 2018-06-11 NOTE — Consult Note (Deleted)
Neurology Consultation  Reason for Consult: Code Stroke Referring Physician: Dr. Madilyn Hook  CC: Right sided numbness  History is obtained from: Patient  HPI: Jessica Hoffman is a 67 y.o. female with pmhx of HTN, and diabetes presenting from home with complaint of right sided numbness. Patient's last known normal was 10 pm last night before going to bed. She called EMS this morning after waking with right sided numbness. At baseline, she is able to care for herself independently and perform all ADLs. She lives alone. On arrival to the ED she was hypertensive 230/120. ED provider noted right visual field defect, right upper extremity weakness, and right arm pronator drift and code stroke was activated. CT head showed early changes consistent with acute / subacute left PCA area infarct with possible involvement of the left thalamus. CTA head was significant for a left P1 segment occlusion. Follow up CT perfusion was without infarct core. Risks and benefits of IR intervention with discussed with the patient. With the early changes on CT she is likely at increased risk for hemorrhage. Patient understood this risk and opted to proceed with intervention.    LKW: 11/11 22:00 tpa given?: no Premorbid modified Rankin scale (mRS):  0-Completely asymptomatic and back to baseline post-stroke 1-No significant post stroke disability and can perform usual duties with stroke symptoms 2-Slight disability-UNABLE to perform all activities but does not need assistance  3-Moderate disability-requires help but walks WITHOUT assistance 4-Needs assistance to walk and tend to bodily needs 5-Severe disability-bedridden, incontinent, needs constant attention 6- Death   ROS: A 14 point ROS was performed and is negative except as noted in the HPI.   Past Medical History:  Diagnosis Date  . Diabetes mellitus without complication (HCC)   . Hypertension    No family history on file.   Social History:   reports that she  has been smoking. She has never used smokeless tobacco. She reports that she drank alcohol. She reports that she does not use drugs.  Medications No current facility-administered medications for this encounter.  No current outpatient medications on file.  Exam: Current vital signs: BP (!) 230/88 (BP Location: Right Arm)   Pulse 68   Temp 97.7 F (36.5 C) (Oral)   Resp 15   Ht 5\' 5"  (1.651 m)   Wt 122.5 kg   SpO2 100%   BMI 44.93 kg/m  Vital signs in last 24 hours: Temp:  [97.7 F (36.5 C)] 97.7 F (36.5 C) (11/12 1031) Pulse Rate:  [66-68] 68 (11/12 1031) Resp:  [13-18] 15 (11/12 1031) BP: (198-230)/(88-108) 230/88 (11/12 1031) SpO2:  [99 %-100 %] 100 % (11/12 1031) Weight:  [122.5 kg] 122.5 kg (11/12 1031)  GENERAL: Awake, alert in NAD HEENT: - Normocephalic and atraumatic, dry mm, no LN++, no Thyromegally LUNGS - Breathing comfortably on RA, normal respiratory rate ABDOMEN - Soft, nontender, nondistended with normoactive BS Ext: warm, well perfused, intact peripheral pulses, __ edema  NEURO:  Mental Status: AA&Ox3  Language: speech is dysarthric.  Naming, repetition, fluency, and comprehension intact. Cranial Nerves: PERRL2 mm/brisk. EOMI, complete right hemianopsia, no facial asymmetry,facial sensation intact, hearing intact, tongue/uvula/soft palate midline, normal sternocleidomastoid and trapezius muscle strength. No evidence of tongue atrophy or fibrillations Motor: Decrease right upper extremity 4/5, 5/5 left upper extremity and lower extremities bilaterally. Right upper extremity pronator drift.   Tone: is normal and bulk is normal Sensation- Decreased sensation to light touch on the right upper extremity  Coordination: Right dysmetria  Gait- deferred  NIHSS:  6   Labs I have reviewed labs in epic and the results pertinent to this consultation are:  CBC    Component Value Date/Time   WBC 7.2 Jul 11, 2018 1045   RBC 5.03 07/11/2018 1045   HGB 15.6 (H)  07-11-2018 1047   HCT 46.0 07-11-18 1047   PLT 260 2018/07/11 1045   MCV 91.7 07-11-18 1045   MCH 28.6 Jul 11, 2018 1045   MCHC 31.2 07-11-18 1045   RDW 12.7 07-11-18 1045   LYMPHSABS 1.6 07-11-18 1045   MONOABS 0.6 07-11-2018 1045   EOSABS 0.1 07-11-18 1045   BASOSABS 0.0 2018-07-11 1045    CMP     Component Value Date/Time   NA 132 (L) 07/11/2018 1047   K 4.3 07-11-18 1047   CL 98 Jul 11, 2018 1047   GLUCOSE 287 (H) 07/11/2018 1047   BUN 10 2018-07-11 1047   CREATININE 0.60 07-11-2018 1047    Lipid Panel  No results found for: CHOL, TRIG, HDL, CHOLHDL, VLDL, LDLCALC, LDLDIRECT   Imaging I have reviewed the images obtained:  CT-scan of the brain: - Acute / subacute left PCA area infarct with possible involvement of the left thalamus  CTA:  - Left P1 segment occlusion  CT Perfusion: - No infarct core  MRI examination of the brain - pending  Assessment: 67 yo F with pmhx of HTN and diabetes who presented with complaint of right sided numbness. Found to have right hemianopsia, right sided weakness, and dysarthria on ED arrival. Last known normal was 10 pm yesterday evening.  Code stroke was activated. CT head with early changes consistent with acute / subacute left PCA infarct. CTA demonstrated P1 occlusion with no infarct core on CT perfusion. Risks & benefits of IR intervention were discussed with the patient including the possible increased risk of bleed with early changes seen on CT. Patient agreed to proceed with intervention.   Impression: Acute left PCA p1 segment infarct, likely atherosclerotic   Recommendations: -- Appreciate IR assistance -- BP control; goal to normal after revascularization, SBP <140  -- MRI brain -- A1c, lipid panel -- ASA 325 -- Atorvastatin  -- Risk factor modification  -- Telemetry  -- PT / OT / speech   Reymundo Poll, M.D. - PGY3 Jul 11, 2018, 11:22 AM

## 2018-06-11 NOTE — H&P (Addendum)
Neurology H&P  Reason for Consult: Code Stroke Referring Physician: Dr. Madilyn Hook  CC: Right sided numbness  History is obtained from: Patient  HPI: Jessica Hoffman is a 67 y.o. female with pmhx of HTN, and diabetes presenting from home with complaint of right sided numbness. Patient's last known normal was 10 pm last night (06/10/18) before going to bed. She called EMS this morning after waking with right sided numbness. At baseline, she is able to care for herself independently and perform all ADLs. She lives alone. On arrival to the ED she was hypertensive 230/120. ED provider noted right visual field defect, right upper extremity weakness, and right arm pronator drift and code stroke was activated. CT head showed early changes consistent with acute / subacute left PCA area infarct with possible involvement of the left thalamus. CTA head was significant for a left P1 segment occlusion. Follow up CT perfusion was without infarct core. Risks and benefits of IR intervention with discussed with the patient. With the early changes on CT she is likely at increased risk for hemorrhage. Patient understood this risk and opted to proceed with intervention.    LKW: 06/10/18@ 22:00 tpa given?: no, OSW Premorbid modified Rankin scale (mRS): 2  ROS:ROS was performed and is negative except as noted in the HPI.   Past Medical History:  Diagnosis Date  . Diabetes mellitus without complication (HCC)   . Hypertension    No family history on file.   Social History:   reports that she has been smoking. She has never used smokeless tobacco. She reports that she drank alcohol. She reports that she does not use drugs.  Medications    Current Facility-Administered Medications:  .   stroke: mapping our early stages of recovery book, , Does not apply, Once, Milon Dikes, MD .  0.9 %  sodium chloride infusion, , Intravenous, Continuous, Milon Dikes, MD .  acetaminophen (TYLENOL) tablet 650 mg, 650 mg, Oral,  Q4H PRN **OR** acetaminophen (TYLENOL) solution 650 mg, 650 mg, Per Tube, Q4H PRN **OR** acetaminophen (TYLENOL) suppository 650 mg, 650 mg, Rectal, Q4H PRN, Milon Dikes, MD .  aspirin 325 MG tablet, , , ,  .  ceFAZolin (ANCEF) 2-4 GM/100ML-% IVPB, , , ,  .  clevidipine (CLEVIPREX) 0.5 MG/ML infusion, , , ,  .  clopidogrel (PLAVIX) 300 MG tablet, , , ,  .  eptifibatide (INTEGRILIN) 20 MG/10ML injection, , , ,  .  fentaNYL (SUBLIMAZE) 100 MCG/2ML injection, , , ,  .  midazolam (VERSED) 2 MG/2ML injection, , , ,  .  nitroGLYCERIN 100 mcg/mL intra-arterial injection, , , ,  .  senna-docusate (Senokot-S) tablet 1 tablet, 1 tablet, Oral, QHS PRN, Milon Dikes, MD .  ticagrelor (BRILINTA) 90 MG tablet, , , ,  .  tirofiban (AGGRASTAT) 5-0.9 MG/100ML-% injection, , , ,  No current outpatient medications on file.  Facility-Administered Medications Ordered in Other Encounters:  .  ceFAZolin (ANCEF) IVPB 1 g/50 mL premix, , Intravenous, Anesthesia Intra-op, Epifanio Lesches, CRNA, 2 g at 07-08-2018 1158 .  fentaNYL (SUBLIMAZE) injection, , , Anesthesia Intra-op, Epifanio Lesches, CRNA, 50 mcg at 07-08-2018 1141 .  lidocaine (cardiac) 100 mg/37mL (XYLOCAINE) injection 2%, , , Anesthesia Intra-op, Epifanio Lesches, CRNA, 50 mg at 07/08/2018 1140 .  lidocaine 2% (20 mg/mL) 5 mL syringe, , Intravenous, Anesthesia Intra-op, Epifanio Lesches, CRNA, 80 mg at July 08, 2018 1140 .  propofol (DIPRIVAN) 10 mg/mL bolus/IV push, , , Anesthesia Intra-op, Epifanio Lesches, CRNA,  100 mg at 06/03/2018 1144 .  rocuronium bromide 10 mg/mL (PF) syringe (5mL), , Intravenous, Anesthesia Intra-op, Epifanio Lesches, CRNA, 50 mg at 06/15/2018 1147 .  succinylcholine (ANECTINE) injection, , , Anesthesia Intra-op, Epifanio Lesches, CRNA, 200 mg at 05/31/2018 1140   Exam: Current vital signs: BP (!) 230/88 (BP Location: Right Arm)   Pulse 68   Temp 97.7 F (36.5 C) (Oral)   Resp 15   Ht 5\' 5"  (1.651 m)   Wt 122.5 kg   SpO2 100%   BMI 44.93 kg/m   Vital signs in last 24 hours: Temp:  [97.7 F (36.5 C)] 97.7 F (36.5 C) (11/12 1031) Pulse Rate:  [66-68] 68 (11/12 1031) Resp:  [13-18] 15 (11/12 1031) BP: (198-230)/(88-108) 230/88 (11/12 1031) SpO2:  [99 %-100 %] 100 % (11/12 1031) Weight:  [122.5 kg] 122.5 kg (11/12 1031) GENERAL: Awake, alert in NAD HEENT: - Normocephalic and atraumatic, dry mm, no LN++, no Thyromegally LUNGS - Breathing comfortably on RA, normal respiratory rate ABDOMEN - Soft, nontender, nondistended with normoactive BS Ext: warm, well perfused, intact peripheral pulses, trace edema bilaterally  NEURO:  Mental Status: AA&Ox3  Language: speech is dysarthric.  Naming, repetition, fluency, and comprehension intact. Cranial Nerves: PERRL2 mm/brisk. EOMI,  right homonymous hemianopsia, no facial asymmetry,facial sensation intact, hearing intact, tongue/uvula/soft palate midline, normal sternocleidomastoid and trapezius muscle strength. No evidence of tongue atrophy or fibrillations Motor: Decrease right upper extremity 4/5, 5/5 left upper extremity and lower extremities bilaterally. Right upper extremity pronator drif also noted .   Tone: is normal and bulk is normal Sensation- Decreased sensation to light touch on the right upper extremity  Coordination: Right dysmetria, disproportionate to weakness.  Other extremities normal. Gait- deferred  NIHSS: 6  Labs I have reviewed labs in epic and the results pertinent to this consultation are:  CBC    Component Value Date/Time   WBC 7.2 05/31/2018 1045   RBC 5.03 06/12/2018 1045   HGB 15.6 (H) 06/18/2018 1047   HCT 46.0 06/14/2018 1047   PLT 260 06/23/2018 1045   MCV 91.7 06/23/2018 1045   MCH 28.6 06/23/2018 1045   MCHC 31.2 06/20/2018 1045   RDW 12.7 06/07/2018 1045   LYMPHSABS 1.6 06/17/2018 1045   MONOABS 0.6 06/28/2018 1045   EOSABS 0.1 06/14/2018 1045   BASOSABS 0.0 06/12/2018 1045    CMP     Component Value Date/Time   NA 132 (L) 06/29/2018  1047   K 4.3 06/28/2018 1047   CL 98 06/26/2018 1047   GLUCOSE 287 (H) 06/09/2018 1047   BUN 10 06/03/2018 1047   CREATININE 0.60 06/12/2018 1047   Imaging I have reviewed the images obtained: CT-scan of the brain: - Acute / subacute left PCA area infarct with possible involvement of the left thalamus, right vertebral artery origin occlusion.  Mild to moderate right PCA atherosclerosis and stenosis, P1 junction.  Bilateral carotid atherosclerosis with moderate left ICA siphon stenosis but no stenosis in the neck.  CTA:  - Left P1 segment occlusion CT Perfusion: - No core, penumbra of 36 cc.  MRI examination of the brain - pending  Assessment: 67 yo F with pmhx of HTN and diabetes who presented with complaint of right sided numbness. Found to have right hemianopsia, right sided weakness, decreased sensation on the right and dysarthria on ED arrival. Last known normal was 10 pm yesterday evening.  Code stroke was activated.  CT head with early changes of stroke in the left  PCA territory.  CTA demonstrated P1 occlusion with no core on CT perfusion, and a 36 mL ischemic penumbra.  Outside the window for IV TPA. Risks & benefits of IR intervention were discussed with the patient including the possible increased risk of bleed with early changes seen on CT. Patient agreed to proceed with intervention.   Impression: Acute left PCA p1 segment infarct, likely atheroembolic- artery to artery embolization versus cardio embolic.  Reymundo Poll, M.D. - PGY3 06/10/2018, 11:22 AM   Attending addendum Patient seen and examined by me as an acute code stroke. I have independently reviewed the images. I have reviewed the history and physical and agree with the above. I have independently performed the physical examination.  NIH stroke scale 6. Discussed risks and benefits of IR guided thrombectomy in detail with the patient-especially because of 40 changes being seen on the CT scan, there could be  potentially high risk of bleed.  She understands the risks and verbalized understanding very clearly.  Assessment as above  Plan:  Acute Ischemic Stroke Cerebral infarction due to embolism of left posterior cerebral artery  Cerebral atherosclerosis  Acuity: Acute Current Suspected Etiology: Under investigation as above Continue Evaluation:  -Admit to: Neuro ICU -Continue Aspirin/ Statin -Blood pressure control, goal of SYS <140 if recanalized -MRI/ECHO/A1C/Lipid panel. -Hyperglycemia management per SSI to maintain glucose 140-180mg /dL. -PT/OT/ST therapies and recommendations when able  CNS -Close neuro monitoring  Dysarthria Dysphagia following cerebral infarction  -NPO until cleared by speech -ST -Advance diet as tolerated  Hemiplegia and hemiparesis following cerebral infarction affecting right dominant side -PT/OT -PM&R consult  RESP No active issues If vented for IR, management per PCCM.  CV Essential (primary) hypertension Hypertensive Emergency-systolic blood pressure in the 250s on arrival.  -Aggressive BP control, goal SBP < 140 if recanalization is achieved. -Labetalol, Cleviprex PRN -TTE  Hyperlipidemia, unspecified  - Statin for goal LDL < 70  HEME -Monitor -transfuse for hgb < 7  ENDO Type 2 diabetes mellitus with hyperglycemia  -SSI -goal HgbA1c < 7  GI/GU -Gentle hydration  Fluid/Electrolyte Disorders -Replete -Repeat labs  ID Possible Aspiration PNA -CXR -NPO -Monitor   Nutrition E66.9 Obesity  -diet consult  Prophylaxis DVT: Pending IR procedure, use SCDs. GI: PPI Bowel: Docusate/senna  Diet: NPO until cleared by speech  Code Status: Full Code   THE FOLLOWING WERE PRESENT ON ADMISSION: Acute ischemic stroke, hemiparesis, hyperglycemia/diabetes  CRITICAL CARE ATTESTATION This patient is critically ill and at significant risk of neurological worsening, death and care requires constant monitoring of vital signs,  hemodynamics, respiratory, and cardiac monitoring. I spent 55  minutes of neurocritical care time performing neurological assessment, discussion with family, other specialists and medical decision making of high complexity in the care of  this patient.  -- Milon Dikes, MD [facility] Pager: [phone] If 7pm to 7am, please call on call as listed on AMION.

## 2018-06-12 ENCOUNTER — Encounter (HOSPITAL_COMMUNITY): Payer: Self-pay | Admitting: Interventional Radiology

## 2018-06-12 ENCOUNTER — Inpatient Hospital Stay (HOSPITAL_COMMUNITY): Payer: Medicare Other

## 2018-06-12 DIAGNOSIS — I63532 Cerebral infarction due to unspecified occlusion or stenosis of left posterior cerebral artery: Principal | ICD-10-CM

## 2018-06-12 DIAGNOSIS — I639 Cerebral infarction, unspecified: Secondary | ICD-10-CM

## 2018-06-12 LAB — ECHOCARDIOGRAM COMPLETE
Height: 67 in
WEIGHTICAEL: 4320 [oz_av]

## 2018-06-12 LAB — CBC WITH DIFFERENTIAL/PLATELET
Abs Immature Granulocytes: 0.09 10*3/uL — ABNORMAL HIGH (ref 0.00–0.07)
Basophils Absolute: 0 10*3/uL (ref 0.0–0.1)
Basophils Relative: 0 %
EOS ABS: 0 10*3/uL (ref 0.0–0.5)
Eosinophils Relative: 0 %
HEMATOCRIT: 42.2 % (ref 36.0–46.0)
Hemoglobin: 13.3 g/dL (ref 12.0–15.0)
Immature Granulocytes: 1 %
LYMPHS ABS: 1.4 10*3/uL (ref 0.7–4.0)
Lymphocytes Relative: 10 %
MCH: 28.9 pg (ref 26.0–34.0)
MCHC: 31.5 g/dL (ref 30.0–36.0)
MCV: 91.7 fL (ref 80.0–100.0)
Monocytes Absolute: 1.2 10*3/uL — ABNORMAL HIGH (ref 0.1–1.0)
Monocytes Relative: 8 %
Neutro Abs: 11.5 10*3/uL — ABNORMAL HIGH (ref 1.7–7.7)
Neutrophils Relative %: 81 %
Platelets: 268 10*3/uL (ref 150–400)
RBC: 4.6 MIL/uL (ref 3.87–5.11)
RDW: 12.7 % (ref 11.5–15.5)
WBC: 14.1 10*3/uL — ABNORMAL HIGH (ref 4.0–10.5)
nRBC: 0 % (ref 0.0–0.2)

## 2018-06-12 LAB — URINALYSIS, ROUTINE W REFLEX MICROSCOPIC
BILIRUBIN URINE: NEGATIVE
Glucose, UA: 150 mg/dL — AB
Hgb urine dipstick: NEGATIVE
Ketones, ur: 5 mg/dL — AB
Leukocytes, UA: NEGATIVE
NITRITE: NEGATIVE
Protein, ur: >=300 mg/dL — AB
pH: 5 (ref 5.0–8.0)

## 2018-06-12 LAB — MAGNESIUM
Magnesium: 2 mg/dL (ref 1.7–2.4)
Magnesium: 2 mg/dL (ref 1.7–2.4)

## 2018-06-12 LAB — BASIC METABOLIC PANEL
Anion gap: 9 (ref 5–15)
BUN: 15 mg/dL (ref 8–23)
CALCIUM: 8.3 mg/dL — AB (ref 8.9–10.3)
CO2: 18 mmol/L — AB (ref 22–32)
CREATININE: 1.36 mg/dL — AB (ref 0.44–1.00)
Chloride: 105 mmol/L (ref 98–111)
GFR calc Af Amer: 45 mL/min — ABNORMAL LOW (ref >60)
GFR calc non Af Amer: 39 mL/min — ABNORMAL LOW (ref >60)
GLUCOSE: 235 mg/dL — AB (ref 70–99)
Potassium: 4.4 mmol/L (ref 3.5–5.1)
Sodium: 132 mmol/L — ABNORMAL LOW (ref 135–145)

## 2018-06-12 LAB — GLUCOSE, CAPILLARY
GLUCOSE-CAPILLARY: 181 mg/dL — AB (ref 70–99)
Glucose-Capillary: 243 mg/dL — ABNORMAL HIGH (ref 70–99)
Glucose-Capillary: 187 mg/dL — ABNORMAL HIGH (ref 70–99)
Glucose-Capillary: 164 mg/dL — ABNORMAL HIGH (ref 70–99)
Glucose-Capillary: 187 mg/dL — ABNORMAL HIGH (ref 70–99)

## 2018-06-12 LAB — PHOSPHORUS
PHOSPHORUS: 4 mg/dL (ref 2.5–4.6)
Phosphorus: 3.3 mg/dL (ref 2.5–4.6)

## 2018-06-12 LAB — LIPID PANEL
CHOL/HDL RATIO: 5.4 ratio
CHOLESTEROL: 212 mg/dL — AB (ref 0–200)
HDL: 39 mg/dL — ABNORMAL LOW (ref >40)
LDL Cholesterol: 135 mg/dL — ABNORMAL HIGH (ref 0–99)
TRIGLYCERIDES: 189 mg/dL — AB (ref <150)
VLDL: 38 mg/dL (ref 0–40)

## 2018-06-12 LAB — RAPID URINE DRUG SCREEN, HOSP PERFORMED
AMPHETAMINES: NOT DETECTED
BENZODIAZEPINES: NOT DETECTED
Barbiturates: NOT DETECTED
Cocaine: NOT DETECTED
Opiates: NOT DETECTED
Tetrahydrocannabinol: NOT DETECTED

## 2018-06-12 LAB — HEMOGLOBIN A1C
Hgb A1c MFr Bld: 10.1 % — ABNORMAL HIGH (ref 4.8–5.6)
Mean Plasma Glucose: 243.17 mg/dL

## 2018-06-12 LAB — HIV ANTIBODY (ROUTINE TESTING W REFLEX): HIV Screen 4th Generation wRfx: NONREACTIVE

## 2018-06-12 MED ORDER — PERFLUTREN LIPID MICROSPHERE
1.0000 mL | INTRAVENOUS | Status: DC | PRN
  Filled 2018-06-12: qty 10

## 2018-06-12 MED ORDER — MIDAZOLAM HCL 2 MG/2ML IJ SOLN
1.0000 mg | INTRAMUSCULAR | Status: DC | PRN
  Administered 2018-06-13 (×2): 1 mg via INTRAVENOUS
  Filled 2018-06-12 (×4): qty 2

## 2018-06-12 MED ORDER — VITAL HIGH PROTEIN PO LIQD
1000.0000 mL | ORAL | Status: DC
  Administered 2018-06-12: 1000 mL

## 2018-06-12 MED ORDER — INSULIN ASPART 100 UNIT/ML ~~LOC~~ SOLN
0.0000 [IU] | SUBCUTANEOUS | Status: DC
  Administered 2018-06-12 (×3): 4 [IU] via SUBCUTANEOUS
  Administered 2018-06-12: 3 [IU] via SUBCUTANEOUS
  Administered 2018-06-13: 4 [IU] via SUBCUTANEOUS
  Administered 2018-06-13: 7 [IU] via SUBCUTANEOUS
  Administered 2018-06-13: 4 [IU] via SUBCUTANEOUS

## 2018-06-12 MED ORDER — INSULIN DETEMIR 100 UNIT/ML ~~LOC~~ SOLN
5.0000 [IU] | Freq: Two times a day (BID) | SUBCUTANEOUS | Status: DC
  Administered 2018-06-12 (×2): 5 [IU] via SUBCUTANEOUS
  Filled 2018-06-12 (×3): qty 0.05

## 2018-06-12 MED ORDER — PRO-STAT SUGAR FREE PO LIQD
30.0000 mL | Freq: Two times a day (BID) | ORAL | Status: DC
  Administered 2018-06-12 (×2): 30 mL
  Filled 2018-06-12 (×2): qty 30

## 2018-06-12 MED ORDER — DOCUSATE SODIUM 50 MG/5ML PO LIQD: 100.0000 mg | Freq: Two times a day (BID) | ORAL | Status: DC | PRN

## 2018-06-12 MED ORDER — BISACODYL 10 MG RE SUPP: 10.0000 mg | Freq: Every day | RECTAL | Status: DC | PRN

## 2018-06-12 NOTE — Progress Notes (Signed)
PT Cancellation Note  Patient Details Name: Alvina ChouMelinda Hoben MRN: [id number] DOB: 07/25/1951   Cancelled Treatment:    Reason Eval/Treat Not Completed: Patient not medically ready. Pt remains sedated from MRI and per RN pt weaning off vent at this time. Acute PT to return as able, as appropriate.  Lewis ShockAshly Simmie Camerer, PT, DPT Acute Rehabilitation Services Pager #: [phone] Office #: [phone]    Iona Hansenshly M Eliza Grissinger 06/12/2018, 12:00 PM

## 2018-06-12 NOTE — Progress Notes (Signed)
NAME:  Jessica Hoffman, MRN:  [id number], DOB:  Feb 19, 1951, LOS: 1 ADMISSION DATE:  06/15/2018, CONSULTATION DATE:  06/10/2018 REFERRING MD:  Corliss Skains  CHIEF COMPLAINT:  AMS   Brief History   Jessica Hoffman is a 67 y.o. female who was admitted 11/12 with left PCA infarct.  Taken to IR for intervention then returned to the ICU on the ventilator.  Past Medical History  HTN, DM.  Significant Hospital Events   11/12 > admit.  Consults:  PCCM 11/12  Procedures:  ETT 11/12 >  IR 11/12 > b/l verterbral artery and L common carotid artery angiograms followed by partial recanalization of occluded left PCA and rescue stenting of reocclusion to the left PCA.  Significant Diagnostic Tests:  CT head 11/12 > left PCA infarct. CTA head / CT cerebral perfusion 11/12 > left PCA occlusion, occlusion or high grade stenosis of right vertebral artery, mild to mod right PCA atherosclerosis and stenosis, b/l carotid atherosclerosis. MRI brain 11/13 >  Echo 11/13 >   Micro Data:  11/12 MRSA PCR >> neg  Antimicrobials:  None.   Interim history/subjective:  No events overnight.  Unable to complete MRI overnight due to numerous continuous infusions Precedex 0.4 and fentanyl gtt at 25 mcg/hr turned off Remains on cardene gtt at 4mg /hr  Objective:  Blood pressure 129/62, pulse (!) 59, temperature 98.5 F (36.9 C), temperature source Axillary, resp. rate 16, height 5\' 7"  (1.702 m), weight 122.5 kg, SpO2 98 %.    Vent Mode: PSV;CPAP FiO2 (%):  [40 %-60 %] 40 % Set Rate:  [14 bmp-18 bmp] 18 bmp Vt Set:  [490 mL] 490 mL PEEP:  [5 cmH20] 5 cmH20 Pressure Support:  [8 cmH20] 8 cmH20 Plateau Pressure:  [21 cmH20-23 cmH20] 21 cmH20   Intake/Output Summary (Last 24 hours) at 06/12/2018 0737 Last data filed at 06/12/2018 0700 Gross per 24 hour  Intake 3702.3 ml  Output 500 ml  Net 3202.3 ml   Filed Weights   06/19/2018 1031  Weight: 122.5 kg    Examination: General:  Obese female still sedate on MV  (sedation turned off minutes prior to my exam) HEENT: MM pink/moist, ETT 7.5 at 23 cm, OGT, left pupil 5/nonreactive, right 3/sluggish, thick neck Neuro: not f/c, moves LUE spont and withdrawls in LLE,  No movement noted on right CV: rrr, no m/r/g, +1-2 distal pulses, right femoral site soft/ no bruising PULM: even/non-labored on MV, lungs bilaterally clear GI: obese, soft, +BS  Extremities: warm/dry, no edema, SCDs bilaterally  Skin: no rashes   Assessment & Plan:   Left PCA infarct - s/p IR intervention. - Post op care per IR. - Stroke workup / management per neuro - MRI brain- scheduled this morning for 9am  Respiratory insufficiency - due to above - Continue MV support - Daily SBT, currently weaning 8/5, hopeful to extubate today if mental status improves and after MRI trip  Hx HTN. - Cleviprex changed to cardene overnight due to triglyceride level 700 - Continue cardene gtt for goal SBP < 140   AKI- sCr 0.6 -> 1.36 - continue gentle hydration of NS 75 ml/hr - trend BMET and UOP (has purwick catheter, episode of incontinence overnight - bladder scans show adequate volume)  Hx DM.  - HgbA1c 10.1 - increase SSI to resistant - add levemir 5 units BID  Leukocytosis - likely reactive, remains without fever  HLD - statin per stroke team   Rest per primary team.   Best Practice:  Diet:  NPO, for now, if not extubated will start TF Pain/Anxiety/Delirium protocol (if indicated): Precedex gtt / fentanyl PRN.  RASS goal 0 to -1. VAP protocol (if indicated): yes DVT prophylaxis: SCD's. GI prophylaxis: Pantoprazole Glucose control: SSI moderate Mobility: Bedrest. Code Status: Full Family Communication: No family at bedside Disposition: ICU.  Labs   CBC: Recent Labs  Lab 06/23/2018 1045 06/06/2018 1047 06/12/18 0348  WBC 7.2  --  14.1*  NEUTROABS 4.8  --  11.5*  HGB 14.4 15.6* 13.3  HCT 46.1* 46.0 42.2  MCV 91.7  --  91.7  PLT 260  --  268   Basic Metabolic  Panel: Recent Labs  Lab 06/15/2018 1045 06/22/2018 1047 06/12/18 0348  NA 130* 132* 132*  K 4.3 4.3 4.4  CL 98 98 105  CO2 23  --  18*  GLUCOSE 293* 287* 235*  BUN 9 10 15   CREATININE 0.73 0.60 1.36*  CALCIUM 8.8*  --  8.3*   GFR: Estimated Creatinine Clearance: 53 mL/min (A) (by C-G formula based on SCr of 1.36 mg/dL (H)). Recent Labs  Lab 06/20/2018 1045 06/12/18 0348  WBC 7.2 14.1*   Liver Function Tests: Recent Labs  Lab 06/26/2018 1045  AST 21  ALT 22  ALKPHOS 66  BILITOT 0.6  PROT 6.7  ALBUMIN 3.3*   No results for input(s): LIPASE, AMYLASE in the last 168 hours. No results for input(s): AMMONIA in the last 168 hours. ABG    Component Value Date/Time   TCO2 24 06/28/2018 1047    Coagulation Profile: Recent Labs  Lab 05/31/2018 1045  INR 0.95   Cardiac Enzymes: No results for input(s): CKTOTAL, CKMB, CKMBINDEX, TROPONINI in the last 168 hours. HbA1C: Hgb A1c MFr Bld  Date/Time Value Ref Range Status  06/12/2018 03:47 AM 10.1 (H) 4.8 - 5.6 % Final    Comment:    (NOTE) Pre diabetes:          5.7%-6.4% Diabetes:              >6.4% Glycemic control for   <7.0% adults with diabetes    CBG: Recent Labs  Lab 06/17/2018 1421 06/06/2018 1749 05/31/2018 1933 06/16/2018 2316 06/12/18 0311  GLUCAP 257* 331* 300* 282* 243*     Critical care time: 35 mins    Jessica Hoffman, AGACNP-BC [facility] Pgr: [phone] or if no answer [phone] 06/12/2018, 7:38 AM

## 2018-06-12 NOTE — Progress Notes (Signed)
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 Corliss Skains. Patient laying in bed intubated and sedated. No spontaneous movements of right side. Bilateral pupils reactive, left 4 mm and right 2 mm. Right groin incision c/d/i.   Allergies: Patient has no known allergies.  Medications: Prior to Admission medications   Not on File     Vital Signs: BP 118/65 (BP Location: Right Arm)   Pulse 62   Temp 98 F (36.7 C) (Axillary)   Resp 12   Ht 5\' 7"  (1.702 m)   Wt 270 lb (122.5 kg)   SpO2 97%   BMI 42.29 kg/m   Physical Exam  Constitutional: She appears well-developed and well-nourished. No distress.  Intubated and sedated.  Pulmonary/Chest: Effort normal. No respiratory distress.  Intubated and sedated.  Neurological:  Intubated and sedated. Speech and comprehension not assessed. Pupils reactive bilaterally, left 4 mm and right 2 mm. EOMs intact bilaterally without nystagmus or subjective diplopia. Visual fields not assessed. No facial asymmetry. Tongue protrusion not assessed. Spontaneously moves left side but no spontaneous movements of right side. Pronator drift not assessed. Fine motor and coordination not assessed. Gait not assessed. Romberg not assessed. Heel to toe not assessed. Distal pulses 1+ bilaterally.  Skin: Skin is warm and dry.  Right groin incision soft without active bleeding or hematoma.  Psychiatric:  Intubated and sedated.  Nursing note and vitals reviewed.   Imaging: Ct Angio Head W Or Wo Contrast  Result Date: 2018/07/01 CLINICAL DATA:  67 year old female with right side weakness. Acute to subacute appearing left PCA territory ischemia on head CT  today. EXAM: CT ANGIOGRAPHY HEAD AND NECK CT PERFUSION BRAIN TECHNIQUE: Multidetector CT imaging of the head and neck was performed using the standard protocol during bolus administration of intravenous contrast. Multiplanar CT image reconstructions and MIPs were obtained to evaluate the vascular anatomy. Carotid stenosis measurements (when applicable) are obtained utilizing NASCET criteria, using the distal internal carotid diameter as the denominator. Multiphase CT imaging of the brain was performed following IV bolus contrast injection. Subsequent parametric perfusion maps were calculated using RAPID software. CONTRAST:  50mL ISOVUE-370 IOPAMIDOL (ISOVUE-370) INJECTION 76% COMPARISON:  Head CT at 1047 hours. FINDINGS: CT Brain Perfusion Findings: CBF (<30%) Volume: None Perfusion (Tmax>6.0s) volume: 36mL Mismatch Volume: 36mL Infarction Location:No core infarct detected by CTP analysis. CTA NECK Skeleton: No acute osseous abnormality identified. Upper chest: Mild upper lung atelectasis and gas trapping suspected. No superior mediastinal lymphadenopathy. Other neck: Negative. Aortic arch: 3 vessel arch configuration. Little to no arch atherosclerosis. Right carotid system: Streak artifact from dense left innominate vein contrast mildly degrades detail of the proximal right CCA. Mild tortuosity. Soft and calcified plaque at the right carotid bifurcation, right ICA origin and bulb without stenosis. Left carotid system: Streak artifact from dense left innominate vein contrast degrades detail of the proximal left CCA. Mild soft and calcified plaque at the left ICA origin and bulb without stenosis. Vertebral arteries: The proximal right subclavian artery appears normal but there is high-grade stenosis or short segment occlusion of the right vertebral artery origin best seen on series 9, image 176. The distal right V1 segment is patent. The right vertebral artery is otherwise patent to the skull base without stenosis.  The proximal left subclavian  artery and left vertebral artery origin appear normal. Mildly tortuous left V1 segment. The left vertebral artery is patent to the skull base without stenosis, and appears mildly dominant. CTA HEAD Posterior circulation: Mildly dominant left V4 segment. No distal vertebral stenosis. PICA origins are patent. Patent vertebrobasilar junction and basilar artery without stenosis. Patent SCA origins. Mild irregularity and stenosis at the right PCA origin and P1 segment. Up to moderate stenosis at the right P1/P2 junction best demonstrated on series 11, image 22. There is preserved distal right PCA enhancement. Poor enhancement of the left PCA from its origin through the P2 segment beyond which the left PCA appears occluded. Posterior communicating arteries are diminutive or absent. Anterior circulation: Both ICA siphons are patent. Bilateral siphon calcified plaque. Mild to moderate left cavernous segment stenosis. No right ICA siphon stenosis. Normal ophthalmic artery origins. Patent carotid termini. Normal MCA and ACA origins. Anterior communicating artery and bilateral ACA branches are within normal limits. Both MCA M1 segments bifurcates early. The left M1 is mildly irregular without stenosis. Left MCA branches are within normal limits. Right MCA M1 segment is mildly irregular without stenosis. Right MCA branches are within normal limits. Venous sinuses: Not evaluated due to early contrast timing. Anatomic variants: Mildly dominant left vertebral artery. Review of the MIP images confirms the above findings IMPRESSION: 1. Positive for Left PCA occlusion. Poor enhancement of the left PCA beginning at the origin. 2. CT Perfusion detects 36 mL of ischemic penumbra in the Left PCA territory, although some of this is known to be core infarct based on the plain Head CT changes at 1047 hours today. 3. Short segment occlusion or high-grade stenosis of the Right Vertebral Artery origin. But  reconstituted enhancement in the right V1 segment and otherwise normal vertebral arteries and basilar. 4. Mild-to-moderate Right PCA atherosclerosis and stenosis, P1/P2 junction. 5. Bilateral carotid atherosclerosis with up to moderate left ICA siphon stenosis but no stenosis in the neck. Study discussed by telephone with Dr. Milon DikesASHISH ARORA on 16-Jan-2018 at 11:37 . Electronically Signed   By: Odessa FlemingH  Hall M.D.   On: 16-Jan-2018 11:37   Ct Head Wo Contrast  Result Date: 16-Jan-2018 CLINICAL DATA:  Status post stent placement EXAM: CT HEAD WITHOUT CONTRAST TECHNIQUE: Contiguous axial images were obtained from the base of the skull through the vertex without intravenous contrast. COMPARISON:  Head CT from earlier today FINDINGS: Brain: More well-defined left PCA territory infarct, especially along the inferior occipital lobe-see reformats. Remote lacunar infarcts in the right caudate nucleus and remote perforator infarct in the left corona radiata. No postprocedural hemorrhage. No hydrocephalus or shift. Vascular: Interval distal basilar to left PCA stent. High-density vessels after contrast load. Skull: Negative Sinuses/Orbits: Negative IMPRESSION: 1. No postprocedural hemorrhage. 2. Acute left occipital infarct that has become more confluent than on head CT earlier today. Electronically Signed   By: Marnee SpringJonathon  Watts M.D.   On: 16-Jan-2018 15:51   Ct Angio Neck W Or Wo Contrast  Result Date: 16-Jan-2018 CLINICAL DATA:  67 year old female with right side weakness. Acute to subacute appearing left PCA territory ischemia on head CT today. EXAM: CT ANGIOGRAPHY HEAD AND NECK CT PERFUSION BRAIN TECHNIQUE: Multidetector CT imaging of the head and neck was performed using the standard protocol during bolus administration of intravenous contrast. Multiplanar CT image reconstructions and MIPs were obtained to evaluate the vascular anatomy. Carotid stenosis measurements (when applicable) are obtained utilizing NASCET criteria,  using the distal internal carotid diameter as the denominator. Multiphase CT imaging  of the brain was performed following IV bolus contrast injection. Subsequent parametric perfusion maps were calculated using RAPID software. CONTRAST:  50mL ISOVUE-370 IOPAMIDOL (ISOVUE-370) INJECTION 76% COMPARISON:  Head CT at 1047 hours. FINDINGS: CT Brain Perfusion Findings: CBF (<30%) Volume: None Perfusion (Tmax>6.0s) volume: 36mL Mismatch Volume: 36mL Infarction Location:No core infarct detected by CTP analysis. CTA NECK Skeleton: No acute osseous abnormality identified. Upper chest: Mild upper lung atelectasis and gas trapping suspected. No superior mediastinal lymphadenopathy. Other neck: Negative. Aortic arch: 3 vessel arch configuration. Little to no arch atherosclerosis. Right carotid system: Streak artifact from dense left innominate vein contrast mildly degrades detail of the proximal right CCA. Mild tortuosity. Soft and calcified plaque at the right carotid bifurcation, right ICA origin and bulb without stenosis. Left carotid system: Streak artifact from dense left innominate vein contrast degrades detail of the proximal left CCA. Mild soft and calcified plaque at the left ICA origin and bulb without stenosis. Vertebral arteries: The proximal right subclavian artery appears normal but there is high-grade stenosis or short segment occlusion of the right vertebral artery origin best seen on series 9, image 176. The distal right V1 segment is patent. The right vertebral artery is otherwise patent to the skull base without stenosis. The proximal left subclavian artery and left vertebral artery origin appear normal. Mildly tortuous left V1 segment. The left vertebral artery is patent to the skull base without stenosis, and appears mildly dominant. CTA HEAD Posterior circulation: Mildly dominant left V4 segment. No distal vertebral stenosis. PICA origins are patent. Patent vertebrobasilar junction and basilar artery without  stenosis. Patent SCA origins. Mild irregularity and stenosis at the right PCA origin and P1 segment. Up to moderate stenosis at the right P1/P2 junction best demonstrated on series 11, image 22. There is preserved distal right PCA enhancement. Poor enhancement of the left PCA from its origin through the P2 segment beyond which the left PCA appears occluded. Posterior communicating arteries are diminutive or absent. Anterior circulation: Both ICA siphons are patent. Bilateral siphon calcified plaque. Mild to moderate left cavernous segment stenosis. No right ICA siphon stenosis. Normal ophthalmic artery origins. Patent carotid termini. Normal MCA and ACA origins. Anterior communicating artery and bilateral ACA branches are within normal limits. Both MCA M1 segments bifurcates early. The left M1 is mildly irregular without stenosis. Left MCA branches are within normal limits. Right MCA M1 segment is mildly irregular without stenosis. Right MCA branches are within normal limits. Venous sinuses: Not evaluated due to early contrast timing. Anatomic variants: Mildly dominant left vertebral artery. Review of the MIP images confirms the above findings IMPRESSION: 1. Positive for Left PCA occlusion. Poor enhancement of the left PCA beginning at the origin. 2. CT Perfusion detects 36 mL of ischemic penumbra in the Left PCA territory, although some of this is known to be core infarct based on the plain Head CT changes at 1047 hours today. 3. Short segment occlusion or high-grade stenosis of the Right Vertebral Artery origin. But reconstituted enhancement in the right V1 segment and otherwise normal vertebral arteries and basilar. 4. Mild-to-moderate Right PCA atherosclerosis and stenosis, P1/P2 junction. 5. Bilateral carotid atherosclerosis with up to moderate left ICA siphon stenosis but no stenosis in the neck. Study discussed by telephone with Dr. Milon Dikes on 06/23/2018 at 11:37 . Electronically Signed   By: Odessa Fleming  M.D.   On: 05/31/2018 11:37   Ct Cerebral Perfusion W Contrast  Result Date: 06/02/2018 CLINICAL DATA:  67 year old female with right side  weakness. Acute to subacute appearing left PCA territory ischemia on head CT today. EXAM: CT ANGIOGRAPHY HEAD AND NECK CT PERFUSION BRAIN TECHNIQUE: Multidetector CT imaging of the head and neck was performed using the standard protocol during bolus administration of intravenous contrast. Multiplanar CT image reconstructions and MIPs were obtained to evaluate the vascular anatomy. Carotid stenosis measurements (when applicable) are obtained utilizing NASCET criteria, using the distal internal carotid diameter as the denominator. Multiphase CT imaging of the brain was performed following IV bolus contrast injection. Subsequent parametric perfusion maps were calculated using RAPID software. CONTRAST:  50mL ISOVUE-370 IOPAMIDOL (ISOVUE-370) INJECTION 76% COMPARISON:  Head CT at 1047 hours. FINDINGS: CT Brain Perfusion Findings: CBF (<30%) Volume: None Perfusion (Tmax>6.0s) volume: 36mL Mismatch Volume: 36mL Infarction Location:No core infarct detected by CTP analysis. CTA NECK Skeleton: No acute osseous abnormality identified. Upper chest: Mild upper lung atelectasis and gas trapping suspected. No superior mediastinal lymphadenopathy. Other neck: Negative. Aortic arch: 3 vessel arch configuration. Little to no arch atherosclerosis. Right carotid system: Streak artifact from dense left innominate vein contrast mildly degrades detail of the proximal right CCA. Mild tortuosity. Soft and calcified plaque at the right carotid bifurcation, right ICA origin and bulb without stenosis. Left carotid system: Streak artifact from dense left innominate vein contrast degrades detail of the proximal left CCA. Mild soft and calcified plaque at the left ICA origin and bulb without stenosis. Vertebral arteries: The proximal right subclavian artery appears normal but there is high-grade stenosis  or short segment occlusion of the right vertebral artery origin best seen on series 9, image 176. The distal right V1 segment is patent. The right vertebral artery is otherwise patent to the skull base without stenosis. The proximal left subclavian artery and left vertebral artery origin appear normal. Mildly tortuous left V1 segment. The left vertebral artery is patent to the skull base without stenosis, and appears mildly dominant. CTA HEAD Posterior circulation: Mildly dominant left V4 segment. No distal vertebral stenosis. PICA origins are patent. Patent vertebrobasilar junction and basilar artery without stenosis. Patent SCA origins. Mild irregularity and stenosis at the right PCA origin and P1 segment. Up to moderate stenosis at the right P1/P2 junction best demonstrated on series 11, image 22. There is preserved distal right PCA enhancement. Poor enhancement of the left PCA from its origin through the P2 segment beyond which the left PCA appears occluded. Posterior communicating arteries are diminutive or absent. Anterior circulation: Both ICA siphons are patent. Bilateral siphon calcified plaque. Mild to moderate left cavernous segment stenosis. No right ICA siphon stenosis. Normal ophthalmic artery origins. Patent carotid termini. Normal MCA and ACA origins. Anterior communicating artery and bilateral ACA branches are within normal limits. Both MCA M1 segments bifurcates early. The left M1 is mildly irregular without stenosis. Left MCA branches are within normal limits. Right MCA M1 segment is mildly irregular without stenosis. Right MCA branches are within normal limits. Venous sinuses: Not evaluated due to early contrast timing. Anatomic variants: Mildly dominant left vertebral artery. Review of the MIP images confirms the above findings IMPRESSION: 1. Positive for Left PCA occlusion. Poor enhancement of the left PCA beginning at the origin. 2. CT Perfusion detects 36 mL of ischemic penumbra in the Left  PCA territory, although some of this is known to be core infarct based on the plain Head CT changes at 1047 hours today. 3. Short segment occlusion or high-grade stenosis of the Right Vertebral Artery origin. But reconstituted enhancement in the right V1 segment and otherwise  normal vertebral arteries and basilar. 4. Mild-to-moderate Right PCA atherosclerosis and stenosis, P1/P2 junction. 5. Bilateral carotid atherosclerosis with up to moderate left ICA siphon stenosis but no stenosis in the neck. Study discussed by telephone with Dr. Milon Dikes on 06/03/2018 at 11:37 . Electronically Signed   By: Odessa Fleming M.D.   On: 06/19/2018 11:37   Dg Chest Port 1 View  Result Date: 06/19/2018 CLINICAL DATA:  Stroke. Cough. EXAM: PORTABLE CHEST 1 VIEW COMPARISON:  04/27/2005 FINDINGS: Endotracheal tube terminates approximately 2 cm above the carina. The cardiac silhouette is mildly enlarged. Lung volumes are low with increased interstitial markings throughout both lungs as well as patchy bilateral perihilar opacity. Focal curvilinear opacity in the lateral left mid lung suggests atelectasis. No sizable pleural effusion or pneumothorax is identified. No acute osseous abnormality is seen. IMPRESSION: 1. Endotracheal tube as above. 2. Low lung volumes with bilateral interstitial and perihilar lung opacities which could reflect edema or infection. Electronically Signed   By: Sebastian Ache M.D.   On: 06/29/2018 16:22   Dg Abd Portable 1v  Result Date: 06/02/2018 CLINICAL DATA:  OG tube placement EXAM: PORTABLE ABDOMEN - 1 VIEW COMPARISON:  None. FINDINGS: Orogastric tube tip and side port project over the stomach. The tip is near the gastroduodenal junction. There is excreted contrast material in the urinary bladder and renal pelvises. IMPRESSION: OG tube tip and side port projecting over the stomach. Electronically Signed   By: Deatra Robinson M.D.   On: 05/31/2018 22:31   Ct Head Code Stroke Wo Contrast  Result Date:  06/20/2018 CLINICAL DATA:  Code stroke. 67 year old female with right side weakness. EXAM: CT HEAD WITHOUT CONTRAST TECHNIQUE: Contiguous axial images were obtained from the base of the skull through the vertex without intravenous contrast. COMPARISON:  None. FINDINGS: Brain: Frontal lobe volume loss bilaterally (coronal image 30). Lacunar infarcts in the left corona radiata and right caudate appear chronic. Age indeterminate left lateral thalamus lacune (series 3, image 18). Additionally there is a 3 centimeter area of cytotoxic edema in the left occipital lobe (also on image 18). No associated hemorrhage or mass effect. No other cytotoxic edema identified. No midline shift, ventriculomegaly, mass effect, evidence of mass lesion, intracranial hemorrhage. No chronic cortical encephalomalacia identified. Vascular: Calcified atherosclerosis at the skull base. Skull: Negative, hyperostosis (normal variant). Sinuses/Orbits: Visualized paranasal sinuses and mastoids are well pneumatized. Other: Visualized orbit soft tissues are within normal limits. No acute scalp soft tissue findings. ASPECTS [facility] Stroke Program Early CT Score) - Ganglionic level infarction (caudate, lentiform nuclei, internal capsule, insula, M1-M3 cortex): 7 - Supraganglionic infarction (M4-M6 cortex): 3 Total score (0-10 with 10 being normal): 10 (left PCA territory abnormalities). IMPRESSION: 1. Acute to subacute appearing left PCA territory infarct with probable involvement of the left thalamus in this clinical setting. No associated hemorrhage or mass effect. 2. 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 3. The nonspecific bilateral frontal lobe atrophy. 4. ASPECTS is 10. 5. These results were communicated to Dr. Wilford Corner at 10:57 amon 11/22/2019by text page via the [facility] messaging system. Electronically Signed   By: Odessa Fleming M.D.   On: 06/05/2018 10:58    Labs:  CBC: Recent Labs    06/25/2018 1045  06/26/2018 1047 06/12/18 0348  WBC 7.2  --  14.1*  HGB 14.4 15.6* 13.3  HCT 46.1* 46.0 42.2  PLT 260  --  268    COAGS: Recent Labs    06/01/2018 1045  INR 0.95  APTT 28    BMP: Recent Labs    2018-06-15 1045 2018-06-15 1047 06/12/18 0348  NA 130* 132* 132*  K 4.3 4.3 4.4  CL 98 98 105  CO2 23  --  18*  GLUCOSE 293* 287* 235*  BUN 9 10 15   CALCIUM 8.8*  --  8.3*  CREATININE 0.73 0.60 1.36*  GFRNONAA >60  --  39*  GFRAA >60  --  45*    LIVER FUNCTION TESTS: Recent Labs    06/15/18 1045  BILITOT 0.6  AST 21  ALT 22  ALKPHOS 66  PROT 6.7  ALBUMIN 3.3*    Assessment and Plan:  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 Corliss Skains. Patient's condition stable- remains intubated and sedated, bilateral pupils reactive but left 4 mm and right 2 mm, no spontaneous movements of right side. Right groin incision stable. Plan for MRI brain today. Continue taking Brilinta 90 mg twice daily and Aspirin 81 mg once daily. Plan to follow-up with Dr. Corliss Skains in clinic 4 weeks after discharge. Appreciate and agree with neurology management. IR to follow.   Electronically Signed: Elwin Mocha, PA-C 06/12/2018, 9:31 AM   I spent a total of 15 Minutes at the the patient's bedside AND on the patient's hospital floor or unit, greater than 50% of which was counseling/coordinating care for left PCA proximal occlusion s/p revascularization.

## 2018-06-12 NOTE — Progress Notes (Signed)
SLP Cancellation Note  Patient Details Name: Jessica ChouMelinda Hoffman MRN: [id number] DOB: 1951-03-09   Cancelled treatment:       Reason Eval/Treat Not Completed: Patient at procedure or test/unavailable. Out of room, also per chart still intubated. Will follow for readiness.    Shandria Clinch, Riley NearingBonnie Caroline 06/12/2018, 9:54 AM

## 2018-06-12 NOTE — Progress Notes (Signed)
  Echocardiogram 2D Echocardiogram has been performed.  Technically difficult due to patient sedated, unable to lay in LLD position and on ventilator. Would recommend waiting until patient is more alert before attempting definity to ensure accuracy and better viewing of LV function.   Jessica Hoffman [name] 06/12/2018, 11:47 AM

## 2018-06-12 NOTE — Progress Notes (Addendum)
STROKE TEAM PROGRESS NOTE   INTERVAL HISTORY Her RN is at the bedside.  No family present. Just back from MRI with shows L PCA as well as R>L brainstem infarcts.   Vitals:   06/12/18 0830 06/12/18 0900 06/12/18 0930 06/12/18 1000  BP:  120/63 125/64 128/63  Pulse: 71 62 60   Resp: 14 18 15    Temp:      TempSrc:      SpO2: 97% 96% 100% 99%  Weight:      Height:        CBC:  Recent Labs  Lab 06/05/2018 1045 06/05/2018 1047 06/12/18 0348  WBC 7.2  --  14.1*  NEUTROABS 4.8  --  11.5*  HGB 14.4 15.6* 13.3  HCT 46.1* 46.0 42.2  MCV 91.7  --  91.7  PLT 260  --  268    Basic Metabolic Panel:  Recent Labs  Lab 05/31/2018 1045 06/22/2018 1047 06/12/18 0348  NA 130* 132* 132*  K 4.3 4.3 4.4  CL 98 98 105  CO2 23  --  18*  GLUCOSE 293* 287* 235*  BUN 9 10 15   CREATININE 0.73 0.60 1.36*  CALCIUM 8.8*  --  8.3*   Lipid Panel:     Component Value Date/Time   CHOL 212 (H) 06/12/2018 0348   TRIG 189 (H) 06/12/2018 0348   HDL 39 (L) 06/12/2018 0348   CHOLHDL 5.4 06/12/2018 0348   VLDL 38 06/12/2018 0348   LDLCALC 135 (H) 06/12/2018 0348   HgbA1c:  Lab Results  Component Value Date   HGBA1C 10.1 (H) 06/12/2018   Urine Drug Screen:     Component Value Date/Time   LABOPIA NONE DETECTED 06/12/2018 0348   COCAINSCRNUR NONE DETECTED 06/12/2018 0348   LABBENZ NONE DETECTED 06/12/2018 0348   AMPHETMU NONE DETECTED 06/12/2018 0348   THCU NONE DETECTED 06/12/2018 0348   LABBARB NONE DETECTED 06/12/2018 0348    Alcohol Level     Component Value Date/Time   ETH <10 06/14/2018 1045    IMAGING Ct Angio Head W Or Wo Contrast  Result Date: 06/10/2018 CLINICAL DATA:  67 year old female with right side weakness. Acute to subacute appearing left PCA territory ischemia on head CT today. EXAM: CT ANGIOGRAPHY HEAD AND NECK CT PERFUSION BRAIN TECHNIQUE: Multidetector CT imaging of the head and neck was performed using the standard protocol during bolus administration of intravenous  contrast. Multiplanar CT image reconstructions and MIPs were obtained to evaluate the vascular anatomy. Carotid stenosis measurements (when applicable) are obtained utilizing NASCET criteria, using the distal internal carotid diameter as the denominator. Multiphase CT imaging of the brain was performed following IV bolus contrast injection. Subsequent parametric perfusion maps were calculated using RAPID software. CONTRAST:  50mL ISOVUE-370 IOPAMIDOL (ISOVUE-370) INJECTION 76% COMPARISON:  Head CT at 1047 hours. FINDINGS: CT Brain Perfusion Findings: CBF (<30%) Volume: None Perfusion (Tmax>6.0s) volume: 36mL Mismatch Volume: 36mL Infarction Location:No core infarct detected by CTP analysis. CTA NECK Skeleton: No acute osseous abnormality identified. Upper chest: Mild upper lung atelectasis and gas trapping suspected. No superior mediastinal lymphadenopathy. Other neck: Negative. Aortic arch: 3 vessel arch configuration. Little to no arch atherosclerosis. Right carotid system: Streak artifact from dense left innominate vein contrast mildly degrades detail of the proximal right CCA. Mild tortuosity. Soft and calcified plaque at the right carotid bifurcation, right ICA origin and bulb without stenosis. Left carotid system: Streak artifact from dense left innominate vein contrast degrades detail of the proximal left CCA. Mild soft and  calcified plaque at the left ICA origin and bulb without stenosis. Vertebral arteries: The proximal right subclavian artery appears normal but there is high-grade stenosis or short segment occlusion of the right vertebral artery origin best seen on series 9, image 176. The distal right V1 segment is patent. The right vertebral artery is otherwise patent to the skull base without stenosis. The proximal left subclavian artery and left vertebral artery origin appear normal. Mildly tortuous left V1 segment. The left vertebral artery is patent to the skull base without stenosis, and appears  mildly dominant. CTA HEAD Posterior circulation: Mildly dominant left V4 segment. No distal vertebral stenosis. PICA origins are patent. Patent vertebrobasilar junction and basilar artery without stenosis. Patent SCA origins. Mild irregularity and stenosis at the right PCA origin and P1 segment. Up to moderate stenosis at the right P1/P2 junction best demonstrated on series 11, image 22. There is preserved distal right PCA enhancement. Poor enhancement of the left PCA from its origin through the P2 segment beyond which the left PCA appears occluded. Posterior communicating arteries are diminutive or absent. Anterior circulation: Both ICA siphons are patent. Bilateral siphon calcified plaque. Mild to moderate left cavernous segment stenosis. No right ICA siphon stenosis. Normal ophthalmic artery origins. Patent carotid termini. Normal MCA and ACA origins. Anterior communicating artery and bilateral ACA branches are within normal limits. Both MCA M1 segments bifurcates early. The left M1 is mildly irregular without stenosis. Left MCA branches are within normal limits. Right MCA M1 segment is mildly irregular without stenosis. Right MCA branches are within normal limits. Venous sinuses: Not evaluated due to early contrast timing. Anatomic variants: Mildly dominant left vertebral artery. Review of the MIP images confirms the above findings IMPRESSION: 1. Positive for Left PCA occlusion. Poor enhancement of the left PCA beginning at the origin. 2. CT Perfusion detects 36 mL of ischemic penumbra in the Left PCA territory, although some of this is known to be core infarct based on the plain Head CT changes at 1047 hours today. 3. Short segment occlusion or high-grade stenosis of the Right Vertebral Artery origin. But reconstituted enhancement in the right V1 segment and otherwise normal vertebral arteries and basilar. 4. Mild-to-moderate Right PCA atherosclerosis and stenosis, P1/P2 junction. 5. Bilateral carotid  atherosclerosis with up to moderate left ICA siphon stenosis but no stenosis in the neck. Study discussed by telephone with Dr. Milon Dikes on 06/06/2018 at 11:37 . Electronically Signed   By: Odessa Fleming M.D.   On: 06/27/2018 11:37   Ct Head Wo Contrast  Result Date: 06/03/2018 CLINICAL DATA:  Status post stent placement EXAM: CT HEAD WITHOUT CONTRAST TECHNIQUE: Contiguous axial images were obtained from the base of the skull through the vertex without intravenous contrast. COMPARISON:  Head CT from earlier today FINDINGS: Brain: More well-defined left PCA territory infarct, especially along the inferior occipital lobe-see reformats. Remote lacunar infarcts in the right caudate nucleus and remote perforator infarct in the left corona radiata. No postprocedural hemorrhage. No hydrocephalus or shift. Vascular: Interval distal basilar to left PCA stent. High-density vessels after contrast load. Skull: Negative Sinuses/Orbits: Negative IMPRESSION: 1. No postprocedural hemorrhage. 2. Acute left occipital infarct that has become more confluent than on head CT earlier today. Electronically Signed   By: Marnee Spring M.D.   On: 06/02/2018 15:51   Ct Angio Neck W Or Wo Contrast  Result Date: 06/25/2018 CLINICAL DATA:  67 year old female with right side weakness. Acute to subacute appearing left PCA territory ischemia on head CT  today. EXAM: CT ANGIOGRAPHY HEAD AND NECK CT PERFUSION BRAIN TECHNIQUE: Multidetector CT imaging of the head and neck was performed using the standard protocol during bolus administration of intravenous contrast. Multiplanar CT image reconstructions and MIPs were obtained to evaluate the vascular anatomy. Carotid stenosis measurements (when applicable) are obtained utilizing NASCET criteria, using the distal internal carotid diameter as the denominator. Multiphase CT imaging of the brain was performed following IV bolus contrast injection. Subsequent parametric perfusion maps were calculated  using RAPID software. CONTRAST:  50mL ISOVUE-370 IOPAMIDOL (ISOVUE-370) INJECTION 76% COMPARISON:  Head CT at 1047 hours. FINDINGS: CT Brain Perfusion Findings: CBF (<30%) Volume: None Perfusion (Tmax>6.0s) volume: 36mL Mismatch Volume: 36mL Infarction Location:No core infarct detected by CTP analysis. CTA NECK Skeleton: No acute osseous abnormality identified. Upper chest: Mild upper lung atelectasis and gas trapping suspected. No superior mediastinal lymphadenopathy. Other neck: Negative. Aortic arch: 3 vessel arch configuration. Little to no arch atherosclerosis. Right carotid system: Streak artifact from dense left innominate vein contrast mildly degrades detail of the proximal right CCA. Mild tortuosity. Soft and calcified plaque at the right carotid bifurcation, right ICA origin and bulb without stenosis. Left carotid system: Streak artifact from dense left innominate vein contrast degrades detail of the proximal left CCA. Mild soft and calcified plaque at the left ICA origin and bulb without stenosis. Vertebral arteries: The proximal right subclavian artery appears normal but there is high-grade stenosis or short segment occlusion of the right vertebral artery origin best seen on series 9, image 176. The distal right V1 segment is patent. The right vertebral artery is otherwise patent to the skull base without stenosis. The proximal left subclavian artery and left vertebral artery origin appear normal. Mildly tortuous left V1 segment. The left vertebral artery is patent to the skull base without stenosis, and appears mildly dominant. CTA HEAD Posterior circulation: Mildly dominant left V4 segment. No distal vertebral stenosis. PICA origins are patent. Patent vertebrobasilar junction and basilar artery without stenosis. Patent SCA origins. Mild irregularity and stenosis at the right PCA origin and P1 segment. Up to moderate stenosis at the right P1/P2 junction best demonstrated on series 11, image 22. There is  preserved distal right PCA enhancement. Poor enhancement of the left PCA from its origin through the P2 segment beyond which the left PCA appears occluded. Posterior communicating arteries are diminutive or absent. Anterior circulation: Both ICA siphons are patent. Bilateral siphon calcified plaque. Mild to moderate left cavernous segment stenosis. No right ICA siphon stenosis. Normal ophthalmic artery origins. Patent carotid termini. Normal MCA and ACA origins. Anterior communicating artery and bilateral ACA branches are within normal limits. Both MCA M1 segments bifurcates early. The left M1 is mildly irregular without stenosis. Left MCA branches are within normal limits. Right MCA M1 segment is mildly irregular without stenosis. Right MCA branches are within normal limits. Venous sinuses: Not evaluated due to early contrast timing. Anatomic variants: Mildly dominant left vertebral artery. Review of the MIP images confirms the above findings IMPRESSION: 1. Positive for Left PCA occlusion. Poor enhancement of the left PCA beginning at the origin. 2. CT Perfusion detects 36 mL of ischemic penumbra in the Left PCA territory, although some of this is known to be core infarct based on the plain Head CT changes at 1047 hours today. 3. Short segment occlusion or high-grade stenosis of the Right Vertebral Artery origin. But reconstituted enhancement in the right V1 segment and otherwise normal vertebral arteries and basilar. 4. Mild-to-moderate Right PCA atherosclerosis and stenosis,  P1/P2 junction. 5. Bilateral carotid atherosclerosis with up to moderate left ICA siphon stenosis but no stenosis in the neck. Study discussed by telephone with Dr. Milon DikesASHISH ARORA on 06/06/2018 at 11:37 . Electronically Signed   By: Odessa FlemingH  Hall M.D.   On: 06/05/2018 11:37   Mr Brain Wo Contrast  Result Date: 06/12/2018 CLINICAL DATA:  Left PCA occlusion status post mechanical thrombectomy and rescue stent assisted angioplasty. EXAM: MRI HEAD  WITHOUT CONTRAST TECHNIQUE: Multiplanar, multiecho pulse sequences of the brain and surrounding structures were obtained without intravenous contrast. COMPARISON:  Head CT 06/26/2018 FINDINGS: Brain: There is an acute large left PCA territory infarct including involvement of the majority of the left thalamus. There is also an acute infarct involving much of the left midbrain, and there is a small acute infarct in the right paracentral pons. Punctate acute infarcts are present in the left cerebellum, mesial right temporal lobe, and right occipital lobe. There is petechial hemorrhage associated with the acute left PCA infarct without malignant hemorrhagic transformation. Cytotoxic edema results in mild mass effect on the left lateral ventricle. There is no midline shift or extra-axial fluid collection. Chronic lacunar infarcts are noted in the basal ganglia bilaterally, right thalamus, left corona radiata. Cerebral atrophy involves the frontal greater than parietal lobes. Vascular: Major intracranial vascular flow voids are preserved. Skull and upper cervical spine: Unremarkable bone marrow signal. Sinuses/Orbits: Unremarkable orbits. Scattered small volume fluid in the paranasal sinuses. At most trace fluid in the mastoid air cells. Other: None. IMPRESSION: 1. Large acute left PCA infarct. 2. Smaller acute infarcts in the brainstem, cerebellum, and right PCA territory. 3. Chronic lacunar infarcts as above. Electronically Signed   By: Sebastian AcheAllen  Grady M.D.   On: 06/12/2018 10:29   Ct Cerebral Perfusion W Contrast  Result Date: 06/10/2018 CLINICAL DATA:  67 year old female with right side weakness. Acute to subacute appearing left PCA territory ischemia on head CT today. EXAM: CT ANGIOGRAPHY HEAD AND NECK CT PERFUSION BRAIN TECHNIQUE: Multidetector CT imaging of the head and neck was performed using the standard protocol during bolus administration of intravenous contrast. Multiplanar CT image reconstructions and MIPs  were obtained to evaluate the vascular anatomy. Carotid stenosis measurements (when applicable) are obtained utilizing NASCET criteria, using the distal internal carotid diameter as the denominator. Multiphase CT imaging of the brain was performed following IV bolus contrast injection. Subsequent parametric perfusion maps were calculated using RAPID software. CONTRAST:  50mL ISOVUE-370 IOPAMIDOL (ISOVUE-370) INJECTION 76% COMPARISON:  Head CT at 1047 hours. FINDINGS: CT Brain Perfusion Findings: CBF (<30%) Volume: None Perfusion (Tmax>6.0s) volume: 36mL Mismatch Volume: 36mL Infarction Location:No core infarct detected by CTP analysis. CTA NECK Skeleton: No acute osseous abnormality identified. Upper chest: Mild upper lung atelectasis and gas trapping suspected. No superior mediastinal lymphadenopathy. Other neck: Negative. Aortic arch: 3 vessel arch configuration. Little to no arch atherosclerosis. Right carotid system: Streak artifact from dense left innominate vein contrast mildly degrades detail of the proximal right CCA. Mild tortuosity. Soft and calcified plaque at the right carotid bifurcation, right ICA origin and bulb without stenosis. Left carotid system: Streak artifact from dense left innominate vein contrast degrades detail of the proximal left CCA. Mild soft and calcified plaque at the left ICA origin and bulb without stenosis. Vertebral arteries: The proximal right subclavian artery appears normal but there is high-grade stenosis or short segment occlusion of the right vertebral artery origin best seen on series 9, image 176. The distal right V1 segment is patent. The right vertebral  artery is otherwise patent to the skull base without stenosis. The proximal left subclavian artery and left vertebral artery origin appear normal. Mildly tortuous left V1 segment. The left vertebral artery is patent to the skull base without stenosis, and appears mildly dominant. CTA HEAD Posterior circulation: Mildly  dominant left V4 segment. No distal vertebral stenosis. PICA origins are patent. Patent vertebrobasilar junction and basilar artery without stenosis. Patent SCA origins. Mild irregularity and stenosis at the right PCA origin and P1 segment. Up to moderate stenosis at the right P1/P2 junction best demonstrated on series 11, image 22. There is preserved distal right PCA enhancement. Poor enhancement of the left PCA from its origin through the P2 segment beyond which the left PCA appears occluded. Posterior communicating arteries are diminutive or absent. Anterior circulation: Both ICA siphons are patent. Bilateral siphon calcified plaque. Mild to moderate left cavernous segment stenosis. No right ICA siphon stenosis. Normal ophthalmic artery origins. Patent carotid termini. Normal MCA and ACA origins. Anterior communicating artery and bilateral ACA branches are within normal limits. Both MCA M1 segments bifurcates early. The left M1 is mildly irregular without stenosis. Left MCA branches are within normal limits. Right MCA M1 segment is mildly irregular without stenosis. Right MCA branches are within normal limits. Venous sinuses: Not evaluated due to early contrast timing. Anatomic variants: Mildly dominant left vertebral artery. Review of the MIP images confirms the above findings IMPRESSION: 1. Positive for Left PCA occlusion. Poor enhancement of the left PCA beginning at the origin. 2. CT Perfusion detects 36 mL of ischemic penumbra in the Left PCA territory, although some of this is known to be core infarct based on the plain Head CT changes at 1047 hours today. 3. Short segment occlusion or high-grade stenosis of the Right Vertebral Artery origin. But reconstituted enhancement in the right V1 segment and otherwise normal vertebral arteries and basilar. 4. Mild-to-moderate Right PCA atherosclerosis and stenosis, P1/P2 junction. 5. Bilateral carotid atherosclerosis with up to moderate left ICA siphon stenosis but  no stenosis in the neck. Study discussed by telephone with Dr. Milon Dikes on 06/01/2018 at 11:37 . Electronically Signed   By: Odessa Fleming M.D.   On: 06/14/2018 11:37   Dg Chest Port 1 View  Result Date: 06/19/2018 CLINICAL DATA:  Stroke. Cough. EXAM: PORTABLE CHEST 1 VIEW COMPARISON:  04/27/2005 FINDINGS: Endotracheal tube terminates approximately 2 cm above the carina. The cardiac silhouette is mildly enlarged. Lung volumes are low with increased interstitial markings throughout both lungs as well as patchy bilateral perihilar opacity. Focal curvilinear opacity in the lateral left mid lung suggests atelectasis. No sizable pleural effusion or pneumothorax is identified. No acute osseous abnormality is seen. IMPRESSION: 1. Endotracheal tube as above. 2. Low lung volumes with bilateral interstitial and perihilar lung opacities which could reflect edema or infection. Electronically Signed   By: Sebastian Ache M.D.   On: 06/12/2018 16:22   Dg Abd Portable 1v  Result Date: 06/07/2018 CLINICAL DATA:  OG tube placement EXAM: PORTABLE ABDOMEN - 1 VIEW COMPARISON:  None. FINDINGS: Orogastric tube tip and side port project over the stomach. The tip is near the gastroduodenal junction. There is excreted contrast material in the urinary bladder and renal pelvises. IMPRESSION: OG tube tip and side port projecting over the stomach. Electronically Signed   By: Deatra Robinson M.D.   On: 06/07/2018 22:31   Ct Head Code Stroke Wo Contrast  Result Date: 06/25/2018 CLINICAL DATA:  Code stroke. 67 year old female with right side weakness.  EXAM: CT HEAD WITHOUT CONTRAST TECHNIQUE: Contiguous axial images were obtained from the base of the skull through the vertex without intravenous contrast. COMPARISON:  None. FINDINGS: Brain: Frontal lobe volume loss bilaterally (coronal image 30). Lacunar infarcts in the left corona radiata and right caudate appear chronic. Age indeterminate left lateral thalamus lacune (series 3, image  18). Additionally there is a 3 centimeter area of cytotoxic edema in the left occipital lobe (also on image 18). No associated hemorrhage or mass effect. No other cytotoxic edema identified. No midline shift, ventriculomegaly, mass effect, evidence of mass lesion, intracranial hemorrhage. No chronic cortical encephalomalacia identified. Vascular: Calcified atherosclerosis at the skull base. Skull: Negative, hyperostosis (normal variant). Sinuses/Orbits: Visualized paranasal sinuses and mastoids are well pneumatized. Other: Visualized orbit soft tissues are within normal limits. No acute scalp soft tissue findings. ASPECTS [facility] Stroke Program Early CT Score) - Ganglionic level infarction (caudate, lentiform nuclei, internal capsule, insula, M1-M3 cortex): 7 - Supraganglionic infarction (M4-M6 cortex): 3 Total score (0-10 with 10 being normal): 10 (left PCA territory abnormalities). IMPRESSION: 1. Acute to subacute appearing left PCA territory infarct with probable involvement of the left thalamus in this clinical setting. No associated hemorrhage or mass effect. 2. 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 3. The nonspecific bilateral frontal lobe atrophy. 4. ASPECTS is 10. 5. These results were communicated to Dr. Wilford Corner at 10:57 amon November 27, 2019by text page via the [facility] [place] messaging system. Electronically Signed   By: Odessa Fleming M.D.   On: 06/26/2018 10:58   Cerebral angiogram  S/P bilateral vert artery angiograms and Lt common carotid arteriogram followed by partial recanalization of occluded Lt PCA prox with x 1 pass with 33mm embotrap retriever device achieving a TICI 2b revascularization followed by rescue stenting of reocclusion of the LT PCA  Due to underlying tandem atherosclerotic stenosis . Occlusion within the stent partially recanalized with 9mm of superselective  IA integrelin and x1 balloon angioplasty.   PHYSICAL EXAM  Patient is intubated and  sedated . Afebrile. Head is nontraumatic. Neck is supple without bruit.    Cardiac exam no murmur or gallop. Lungs are clear to auscultation. Distal pulses are well felt. Neurological Exam :  intubated, sedated.she does not follow any commands. Eyes are closed. Pupils 4 mm sluggishly reactive. Left gaze preference. Doll's eye movements sluggish. Does not blink to threat bilaterally. Mild right lower facial asymmetry. Tongue midline.she has dense right hemiplegia and will not withdraw to pain in the upper extremity but does show partial in the right. She has brisk withdrawal to pain in the left upper and lower extremity. ASSESSMENT/PLAN Ms. Zoanne Newill is a 67 y.o. female with history of hypertension, diabetes presenting with right-sided numbness.   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  Old left corona radiata and right caudate infarcts.  Nonspecific bilateral frontal lobe atrophy.  ASPECTS 10.     CTA head & neck positive L PCA occlusion.  Short segment occlusion R VA origin.  Mild to moderate R PCA atherosclerosis and stenosis P1/2 junction.  The ICA atherosclerosis with moderate L ICA siphon stenosis but none in the neck.  CT perfusion 36 mL penumbra left PCA some is core infarct based on plain  CT  Post IR CT no hemorrhage.  Acute left occipital infarct  MRI large left PCA infarct.  Smaller L>R brainstem, cerebellum and right PCA territory infarcts.  Chronically [place].  2D Echo  pending   LDL 135  HgbA1c 10.1  SCDs for VTE prophylaxis  Diet Order            Diet NPO time specified  Diet effective now              No antithrombotic prior to admission, now on aspirin 81 mg daily and Brilinta 90 bid. Continue at d/c  Therapy  recommendations:  pending   Disposition:  pending   Acute Respiratory Failure  Intubated  On fentanyl and precedex  CCM on board  Dysphagia secondary to stroke  Diet Order            Diet NPO time specified  Diet effective now             started on TF  Hypertension  Blood pressure management per neuro interventional etiologies x24 hours post IR   Initially on cleviprex changed to cardene d/t increased triglycerides   Hyperlipidemia  Home meds:  No statin  LDL 135, goal < 70  Add statin prior to discharge based on plan of care  Diabetes type II  HgbA1c 10.1, goal < 7.0  Uncontrolled  CBGs  SSI  levemir 5 bid  Other Stroke Risk Factors  Advanced age  Cigarette smoker  ETOH use  Morbid Obesity, Body mass index is 42.29 kg/m., recommend weight loss, diet and exercise as appropriate   Other Active Problems  AKI, Cr 0.6->1.36. On IVF  Leukocytosis, 14.1 without fever, likely reactive  Per triglyceridemia improved off propofol and clevipine  Hospital day # 1  Annie Main, MSN, APRN, ANVP-BC, AGPCNP-BC Advanced Practice Stroke Nurse [facility] See Amion for Schedule & Pager information 06/12/2018 3:15 PM  I have personally examined this patient, reviewed notes, independently viewed imaging studies, participated in medical decision making and plan of care.ROS completed by me personally and pertinent positives fully documented  I have made any additions or clarifications directly to the above note. 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 She is likely to have significant neurological deficits and may need prolonged ventilatory support and feeding tube. No family available at the bedside for discussion. Discussed  with Dr. Denese Killings. And Dr Corliss Skains.Continue respite 3 support for today. Will lessen sedation tomorrow-evaluate and decide on extubation versus prolonged intubation and tracheostomy and PEG tube after discussion with family and goals of care. This patient is critically ill and at significant risk of neurological worsening, death and care requires constant monitoring of vital signs, hemodynamics,respiratory and cardiac monitoring, extensive review of multiple databases, frequent neurological assessment, discussion with family, other specialists and medical decision making of high complexity.I have made any additions or clarifications directly to the above note.This critical care time does not reflect procedure time, or teaching time or supervisory time of PA/NP/Med Resident etc but could involve care discussion time.  I spent 30 minutes of neurocritical care time  in the care of  this patient.     Delia Heady, MD Medical Director [facility] Pager: [phone] 06/12/2018 5:24 PM  To contact Stroke Continuity provider, please refer to [email or web address]. After hours,  contact General Neurology

## 2018-06-12 NOTE — Progress Notes (Signed)
OT Cancellation Note  Patient Details Name: Jessica ChouMelinda Hoffman MRN: [id number] DOB: 1950-12-21   Cancelled Treatment:    Reason Eval/Treat Not Completed: Patient not medically ready(intubated s/p sheath removal)  Fontaine NoBrynn Haruna Rohlfs   [name], OTR/L  Acute Rehabilitation Services Pager: [phone] Office: [phone] .  06/12/2018, 11:04 AM

## 2018-06-13 ENCOUNTER — Inpatient Hospital Stay (HOSPITAL_COMMUNITY): Payer: Medicare Other

## 2018-06-13 DIAGNOSIS — I619 Nontraumatic intracerebral hemorrhage, unspecified: Secondary | ICD-10-CM

## 2018-06-13 LAB — BASIC METABOLIC PANEL
Anion gap: 6 (ref 5–15)
BUN: 19 mg/dL (ref 8–23)
CHLORIDE: 107 mmol/L (ref 98–111)
CO2: 21 mmol/L — AB (ref 22–32)
CREATININE: 0.77 mg/dL (ref 0.44–1.00)
Calcium: 8.5 mg/dL — ABNORMAL LOW (ref 8.9–10.3)
GFR calc Af Amer: >60 mL/min (ref >60)
GFR calc non Af Amer: >60 mL/min (ref >60)
GLUCOSE: 223 mg/dL — AB (ref 70–99)
Potassium: 4.3 mmol/L (ref 3.5–5.1)
SODIUM: 134 mmol/L — AB (ref 135–145)

## 2018-06-13 LAB — CBC
HEMATOCRIT: 38.2 % (ref 36.0–46.0)
Hemoglobin: 12.5 g/dL (ref 12.0–15.0)
MCH: 30.4 pg (ref 26.0–34.0)
MCHC: 32.7 g/dL (ref 30.0–36.0)
MCV: 92.9 fL (ref 80.0–100.0)
NRBC: 0 % (ref 0.0–0.2)
PLATELETS: 249 10*3/uL (ref 150–400)
RBC: 4.11 MIL/uL (ref 3.87–5.11)
RDW: 13.3 % (ref 11.5–15.5)
WBC: 12.7 10*3/uL — AB (ref 4.0–10.5)

## 2018-06-13 LAB — GLUCOSE, CAPILLARY
GLUCOSE-CAPILLARY: 205 mg/dL — AB (ref 70–99)
Glucose-Capillary: 172 mg/dL — ABNORMAL HIGH (ref 70–99)
Glucose-Capillary: 189 mg/dL — ABNORMAL HIGH (ref 70–99)

## 2018-06-13 LAB — PHOSPHORUS: PHOSPHORUS: 3.1 mg/dL (ref 2.5–4.6)

## 2018-06-13 LAB — MAGNESIUM: MAGNESIUM: 2.1 mg/dL (ref 1.7–2.4)

## 2018-06-13 MED ORDER — MORPHINE BOLUS VIA INFUSION
5.0000 mg | INTRAVENOUS | Status: DC | PRN
  Administered 2018-06-13 (×11): 5 mg via INTRAVENOUS
  Filled 2018-06-13: qty 5

## 2018-06-13 MED ORDER — GLYCOPYRROLATE 0.2 MG/ML IJ SOLN
0.2000 mg | INTRAMUSCULAR | Status: DC | PRN
  Filled 2018-06-13: qty 1

## 2018-06-13 MED ORDER — ALBUTEROL SULFATE (2.5 MG/3ML) 0.083% IN NEBU: 2.5000 mg | INHALATION_SOLUTION | RESPIRATORY_TRACT | Status: DC | PRN

## 2018-06-13 MED ORDER — MORPHINE SULFATE (PF) 2 MG/ML IV SOLN: 2.0000 mg | INTRAVENOUS | Status: DC | PRN

## 2018-06-13 MED ORDER — MORPHINE 100MG IN NS 100ML (1MG/ML) PREMIX INFUSION
0.0000 mg/h | INTRAVENOUS | Status: DC
  Administered 2018-06-13: 20 mg/h via INTRAVENOUS
  Administered 2018-06-13: 5 mg/h via INTRAVENOUS
  Filled 2018-06-13 (×2): qty 100

## 2018-06-13 MED ORDER — INSULIN ASPART 100 UNIT/ML ~~LOC~~ SOLN: 2.0000 [IU] | SUBCUTANEOUS | Status: DC

## 2018-06-13 MED ORDER — MORPHINE SULFATE (PF) 4 MG/ML IV SOLN
4.0000 mg | INTRAVENOUS | Status: DC | PRN
  Administered 2018-06-13: 4 mg via INTRAVENOUS
  Filled 2018-06-13: qty 1

## 2018-06-13 MED ORDER — FUROSEMIDE 10 MG/ML IJ SOLN
20.0000 mg | Freq: Two times a day (BID) | INTRAMUSCULAR | Status: DC
  Filled 2018-06-13: qty 2

## 2018-06-13 MED ORDER — GLYCOPYRROLATE 1 MG PO TABS: 1.0000 mg | ORAL_TABLET | ORAL | Status: DC | PRN

## 2018-06-13 MED ORDER — LORAZEPAM 2 MG/ML IJ SOLN
2.0000 mg | INTRAMUSCULAR | Status: DC | PRN
  Administered 2018-06-13: 4 mg via INTRAVENOUS
  Filled 2018-06-13: qty 2

## 2018-06-13 MED ORDER — POTASSIUM CHLORIDE 20 MEQ/15ML (10%) PO SOLN
20.0000 meq | Freq: Once | ORAL | Status: DC
  Filled 2018-06-13: qty 15

## 2018-06-13 MED ORDER — INSULIN DETEMIR 100 UNIT/ML ~~LOC~~ SOLN
6.0000 [IU] | Freq: Two times a day (BID) | SUBCUTANEOUS | Status: DC
  Filled 2018-06-13: qty 0.06

## 2018-06-13 MED ORDER — DIPHENHYDRAMINE HCL 50 MG/ML IJ SOLN: 25.0000 mg | INTRAMUSCULAR | Status: DC | PRN

## 2018-06-13 MED ORDER — POLYVINYL ALCOHOL 1.4 % OP SOLN
1.0000 [drp] | Freq: Four times a day (QID) | OPHTHALMIC | Status: DC | PRN
  Filled 2018-06-13: qty 15

## 2018-06-13 MED ORDER — BUDESONIDE 0.5 MG/2ML IN SUSP
0.5000 mg | Freq: Two times a day (BID) | RESPIRATORY_TRACT | Status: DC
  Administered 2018-06-13: 0.5 mg via RESPIRATORY_TRACT
  Filled 2018-06-13: qty 2

## 2018-06-13 MED ORDER — IPRATROPIUM-ALBUTEROL 0.5-2.5 (3) MG/3ML IN SOLN: 3.0000 mL | RESPIRATORY_TRACT | Status: DC

## 2018-06-13 MED ORDER — GLYCOPYRROLATE 0.2 MG/ML IJ SOLN: 0.2000 mg | INTRAMUSCULAR | Status: DC | PRN

## 2018-06-13 MED ORDER — IPRATROPIUM-ALBUTEROL 0.5-2.5 (3) MG/3ML IN SOLN
3.0000 mL | RESPIRATORY_TRACT | Status: DC | PRN
  Administered 2018-06-13: 3 mL via RESPIRATORY_TRACT
  Filled 2018-06-13: qty 3

## 2018-06-14 ENCOUNTER — Encounter (HOSPITAL_COMMUNITY): Payer: Self-pay | Admitting: Interventional Radiology

## 2018-06-30 NOTE — Progress Notes (Signed)
Referring Physician(s): CODE STROKE - Milon Dikes  Supervising Physician: Julieanne Cotton  Patient Status:  [facility] - In-pt  Chief Complaint: Follow-up mechanical thrombectomy and stent assisted angioplasty 11/12 with Dr. Corliss Skains.  Subjective:  Patient intubated, sedated. No spontaneous movements on exam.   Allergies: Patient has no known allergies.  Medications: Prior to Admission medications   Not on File     Vital Signs: BP (!) 102/55 (BP Location: Right Arm)   Pulse 82   Temp (!) 100.9 F (38.3 C) (Axillary) Comment: Nurse notified  Resp 11   Ht 5\' 5"  (1.651 m)   Wt 293 lb 14 oz (133.3 kg)   SpO2 100%   BMI 48.90 kg/m   Physical Exam  Constitutional:  Obese  Cardiovascular: Normal rate, regular rhythm and normal heart sounds.  Pulmonary/Chest:  Intubated, mechanically ventilated.  Abdominal: Soft.  Neurological:  Sedated. No spontaneous movements. Right pupil 2 mm, left pupil 4 mm - sluggish to light. No eye movement with head tilt. No reaction to deep touch. Distal pulses difficult to palpate bilaterally.   Skin: Skin is warm and dry.  Nursing note and vitals reviewed.   Imaging: Ct Angio Head W Or Wo Contrast  Result Date: 06/29/2018 CLINICAL DATA:  67 year old female with right side weakness. Acute to subacute appearing left PCA territory ischemia on head CT today. EXAM: CT ANGIOGRAPHY HEAD AND NECK CT PERFUSION BRAIN TECHNIQUE: Multidetector CT imaging of the head and neck was performed using the standard protocol during bolus administration of intravenous contrast. Multiplanar CT image reconstructions and MIPs were obtained to evaluate the vascular anatomy. Carotid stenosis measurements (when applicable) are obtained utilizing NASCET criteria, using the distal internal carotid diameter as the denominator. Multiphase CT imaging of the brain was performed following IV bolus contrast injection. Subsequent parametric perfusion maps were calculated using  RAPID software. CONTRAST:  50mL ISOVUE-370 IOPAMIDOL (ISOVUE-370) INJECTION 76% COMPARISON:  Head CT at 1047 hours. FINDINGS: CT Brain Perfusion Findings: CBF (<30%) Volume: None Perfusion (Tmax>6.0s) volume: 36mL Mismatch Volume: 36mL Infarction Location:No core infarct detected by CTP analysis. CTA NECK Skeleton: No acute osseous abnormality identified. Upper chest: Mild upper lung atelectasis and gas trapping suspected. No superior mediastinal lymphadenopathy. Other neck: Negative. Aortic arch: 3 vessel arch configuration. Little to no arch atherosclerosis. Right carotid system: Streak artifact from dense left innominate vein contrast mildly degrades detail of the proximal right CCA. Mild tortuosity. Soft and calcified plaque at the right carotid bifurcation, right ICA origin and bulb without stenosis. Left carotid system: Streak artifact from dense left innominate vein contrast degrades detail of the proximal left CCA. Mild soft and calcified plaque at the left ICA origin and bulb without stenosis. Vertebral arteries: The proximal right subclavian artery appears normal but there is high-grade stenosis or short segment occlusion of the right vertebral artery origin best seen on series 9, image 176. The distal right V1 segment is patent. The right vertebral artery is otherwise patent to the skull base without stenosis. The proximal left subclavian artery and left vertebral artery origin appear normal. Mildly tortuous left V1 segment. The left vertebral artery is patent to the skull base without stenosis, and appears mildly dominant. CTA HEAD Posterior circulation: Mildly dominant left V4 segment. No distal vertebral stenosis. PICA origins are patent. Patent vertebrobasilar junction and basilar artery without stenosis. Patent SCA origins. Mild irregularity and stenosis at the right PCA origin and P1 segment. Up to moderate stenosis at the right P1/P2 junction best demonstrated on series  11, image 22. There is  preserved distal right PCA enhancement. Poor enhancement of the left PCA from its origin through the P2 segment beyond which the left PCA appears occluded. Posterior communicating arteries are diminutive or absent. Anterior circulation: Both ICA siphons are patent. Bilateral siphon calcified plaque. Mild to moderate left cavernous segment stenosis. No right ICA siphon stenosis. Normal ophthalmic artery origins. Patent carotid termini. Normal MCA and ACA origins. Anterior communicating artery and bilateral ACA branches are within normal limits. Both MCA M1 segments bifurcates early. The left M1 is mildly irregular without stenosis. Left MCA branches are within normal limits. Right MCA M1 segment is mildly irregular without stenosis. Right MCA branches are within normal limits. Venous sinuses: Not evaluated due to early contrast timing. Anatomic variants: Mildly dominant left vertebral artery. Review of the MIP images confirms the above findings IMPRESSION: 1. Positive for Left PCA occlusion. Poor enhancement of the left PCA beginning at the origin. 2. CT Perfusion detects 36 mL of ischemic penumbra in the Left PCA territory, although some of this is known to be core infarct based on the plain Head CT changes at 1047 hours today. 3. Short segment occlusion or high-grade stenosis of the Right Vertebral Artery origin. But reconstituted enhancement in the right V1 segment and otherwise normal vertebral arteries and basilar. 4. Mild-to-moderate Right PCA atherosclerosis and stenosis, P1/P2 junction. 5. Bilateral carotid atherosclerosis with up to moderate left ICA siphon stenosis but no stenosis in the neck. Study discussed by telephone with Dr. Milon DikesASHISH ARORA on 06/17/2018 at 11:37 . Electronically Signed   By: Odessa FlemingH  Hall M.D.   On: 06/09/2018 11:37   Ct Head Wo Contrast  Result Date: 06/06/2018 CLINICAL DATA:  Status post stent placement EXAM: CT HEAD WITHOUT CONTRAST TECHNIQUE: Contiguous axial images were obtained  from the base of the skull through the vertex without intravenous contrast. COMPARISON:  Head CT from earlier today FINDINGS: Brain: More well-defined left PCA territory infarct, especially along the inferior occipital lobe-see reformats. Remote lacunar infarcts in the right caudate nucleus and remote perforator infarct in the left corona radiata. No postprocedural hemorrhage. No hydrocephalus or shift. Vascular: Interval distal basilar to left PCA stent. High-density vessels after contrast load. Skull: Negative Sinuses/Orbits: Negative IMPRESSION: 1. No postprocedural hemorrhage. 2. Acute left occipital infarct that has become more confluent than on head CT earlier today. Electronically Signed   By: Marnee SpringJonathon  Watts M.D.   On: 06/01/2018 15:51   Ct Angio Neck W Or Wo Contrast  Result Date: 06/23/2018 CLINICAL DATA:  67 year old female with right side weakness. Acute to subacute appearing left PCA territory ischemia on head CT today. EXAM: CT ANGIOGRAPHY HEAD AND NECK CT PERFUSION BRAIN TECHNIQUE: Multidetector CT imaging of the head and neck was performed using the standard protocol during bolus administration of intravenous contrast. Multiplanar CT image reconstructions and MIPs were obtained to evaluate the vascular anatomy. Carotid stenosis measurements (when applicable) are obtained utilizing NASCET criteria, using the distal internal carotid diameter as the denominator. Multiphase CT imaging of the brain was performed following IV bolus contrast injection. Subsequent parametric perfusion maps were calculated using RAPID software. CONTRAST:  50mL ISOVUE-370 IOPAMIDOL (ISOVUE-370) INJECTION 76% COMPARISON:  Head CT at 1047 hours. FINDINGS: CT Brain Perfusion Findings: CBF (<30%) Volume: None Perfusion (Tmax>6.0s) volume: 36mL Mismatch Volume: 36mL Infarction Location:No core infarct detected by CTP analysis. CTA NECK Skeleton: No acute osseous abnormality identified. Upper chest: Mild upper lung atelectasis  and gas trapping suspected. No superior mediastinal lymphadenopathy. Other  neck: Negative. Aortic arch: 3 vessel arch configuration. Little to no arch atherosclerosis. Right carotid system: Streak artifact from dense left innominate vein contrast mildly degrades detail of the proximal right CCA. Mild tortuosity. Soft and calcified plaque at the right carotid bifurcation, right ICA origin and bulb without stenosis. Left carotid system: Streak artifact from dense left innominate vein contrast degrades detail of the proximal left CCA. Mild soft and calcified plaque at the left ICA origin and bulb without stenosis. Vertebral arteries: The proximal right subclavian artery appears normal but there is high-grade stenosis or short segment occlusion of the right vertebral artery origin best seen on series 9, image 176. The distal right V1 segment is patent. The right vertebral artery is otherwise patent to the skull base without stenosis. The proximal left subclavian artery and left vertebral artery origin appear normal. Mildly tortuous left V1 segment. The left vertebral artery is patent to the skull base without stenosis, and appears mildly dominant. CTA HEAD Posterior circulation: Mildly dominant left V4 segment. No distal vertebral stenosis. PICA origins are patent. Patent vertebrobasilar junction and basilar artery without stenosis. Patent SCA origins. Mild irregularity and stenosis at the right PCA origin and P1 segment. Up to moderate stenosis at the right P1/P2 junction best demonstrated on series 11, image 22. There is preserved distal right PCA enhancement. Poor enhancement of the left PCA from its origin through the P2 segment beyond which the left PCA appears occluded. Posterior communicating arteries are diminutive or absent. Anterior circulation: Both ICA siphons are patent. Bilateral siphon calcified plaque. Mild to moderate left cavernous segment stenosis. No right ICA siphon stenosis. Normal ophthalmic  artery origins. Patent carotid termini. Normal MCA and ACA origins. Anterior communicating artery and bilateral ACA branches are within normal limits. Both MCA M1 segments bifurcates early. The left M1 is mildly irregular without stenosis. Left MCA branches are within normal limits. Right MCA M1 segment is mildly irregular without stenosis. Right MCA branches are within normal limits. Venous sinuses: Not evaluated due to early contrast timing. Anatomic variants: Mildly dominant left vertebral artery. Review of the MIP images confirms the above findings IMPRESSION: 1. Positive for Left PCA occlusion. Poor enhancement of the left PCA beginning at the origin. 2. CT Perfusion detects 36 mL of ischemic penumbra in the Left PCA territory, although some of this is known to be core infarct based on the plain Head CT changes at 1047 hours today. 3. Short segment occlusion or high-grade stenosis of the Right Vertebral Artery origin. But reconstituted enhancement in the right V1 segment and otherwise normal vertebral arteries and basilar. 4. Mild-to-moderate Right PCA atherosclerosis and stenosis, P1/P2 junction. 5. Bilateral carotid atherosclerosis with up to moderate left ICA siphon stenosis but no stenosis in the neck. Study discussed by telephone with Dr. Milon Dikes on 06/09/2018 at 11:37 . Electronically Signed   By: Odessa Fleming M.D.   On: 06/20/2018 11:37   Mr Brain Wo Contrast  Result Date: 06/12/2018 CLINICAL DATA:  Left PCA occlusion status post mechanical thrombectomy and rescue stent assisted angioplasty. EXAM: MRI HEAD WITHOUT CONTRAST TECHNIQUE: Multiplanar, multiecho pulse sequences of the brain and surrounding structures were obtained without intravenous contrast. COMPARISON:  Head CT 06/27/2018 FINDINGS: Brain: There is an acute large left PCA territory infarct including involvement of the majority of the left thalamus. There is also an acute infarct involving much of the left midbrain, and there is a small  acute infarct in the right paracentral pons. Punctate acute infarcts are present  in the left cerebellum, mesial right temporal lobe, and right occipital lobe. There is petechial hemorrhage associated with the acute left PCA infarct without malignant hemorrhagic transformation. Cytotoxic edema results in mild mass effect on the left lateral ventricle. There is no midline shift or extra-axial fluid collection. Chronic lacunar infarcts are noted in the basal ganglia bilaterally, right thalamus, left corona radiata. Cerebral atrophy involves the frontal greater than parietal lobes. Vascular: Major intracranial vascular flow voids are preserved. Skull and upper cervical spine: Unremarkable bone marrow signal. Sinuses/Orbits: Unremarkable orbits. Scattered small volume fluid in the paranasal sinuses. At most trace fluid in the mastoid air cells. Other: None. IMPRESSION: 1. Large acute left PCA infarct. 2. Smaller acute infarcts in the brainstem, cerebellum, and right PCA territory. 3. Chronic lacunar infarcts as above. Electronically Signed   By: Sebastian Ache M.D.   On: 06/12/2018 10:29   Ct Cerebral Perfusion W Contrast  Result Date: 06/10/2018 CLINICAL DATA:  67 year old female with right side weakness. Acute to subacute appearing left PCA territory ischemia on head CT today. EXAM: CT ANGIOGRAPHY HEAD AND NECK CT PERFUSION BRAIN TECHNIQUE: Multidetector CT imaging of the head and neck was performed using the standard protocol during bolus administration of intravenous contrast. Multiplanar CT image reconstructions and MIPs were obtained to evaluate the vascular anatomy. Carotid stenosis measurements (when applicable) are obtained utilizing NASCET criteria, using the distal internal carotid diameter as the denominator. Multiphase CT imaging of the brain was performed following IV bolus contrast injection. Subsequent parametric perfusion maps were calculated using RAPID software. CONTRAST:  50mL ISOVUE-370 IOPAMIDOL  (ISOVUE-370) INJECTION 76% COMPARISON:  Head CT at 1047 hours. FINDINGS: CT Brain Perfusion Findings: CBF (<30%) Volume: None Perfusion (Tmax>6.0s) volume: 36mL Mismatch Volume: 36mL Infarction Location:No core infarct detected by CTP analysis. CTA NECK Skeleton: No acute osseous abnormality identified. Upper chest: Mild upper lung atelectasis and gas trapping suspected. No superior mediastinal lymphadenopathy. Other neck: Negative. Aortic arch: 3 vessel arch configuration. Little to no arch atherosclerosis. Right carotid system: Streak artifact from dense left innominate vein contrast mildly degrades detail of the proximal right CCA. Mild tortuosity. Soft and calcified plaque at the right carotid bifurcation, right ICA origin and bulb without stenosis. Left carotid system: Streak artifact from dense left innominate vein contrast degrades detail of the proximal left CCA. Mild soft and calcified plaque at the left ICA origin and bulb without stenosis. Vertebral arteries: The proximal right subclavian artery appears normal but there is high-grade stenosis or short segment occlusion of the right vertebral artery origin best seen on series 9, image 176. The distal right V1 segment is patent. The right vertebral artery is otherwise patent to the skull base without stenosis. The proximal left subclavian artery and left vertebral artery origin appear normal. Mildly tortuous left V1 segment. The left vertebral artery is patent to the skull base without stenosis, and appears mildly dominant. CTA HEAD Posterior circulation: Mildly dominant left V4 segment. No distal vertebral stenosis. PICA origins are patent. Patent vertebrobasilar junction and basilar artery without stenosis. Patent SCA origins. Mild irregularity and stenosis at the right PCA origin and P1 segment. Up to moderate stenosis at the right P1/P2 junction best demonstrated on series 11, image 22. There is preserved distal right PCA enhancement. Poor enhancement  of the left PCA from its origin through the P2 segment beyond which the left PCA appears occluded. Posterior communicating arteries are diminutive or absent. Anterior circulation: Both ICA siphons are patent. Bilateral siphon calcified plaque. Mild to moderate  left cavernous segment stenosis. No right ICA siphon stenosis. Normal ophthalmic artery origins. Patent carotid termini. Normal MCA and ACA origins. Anterior communicating artery and bilateral ACA branches are within normal limits. Both MCA M1 segments bifurcates early. The left M1 is mildly irregular without stenosis. Left MCA branches are within normal limits. Right MCA M1 segment is mildly irregular without stenosis. Right MCA branches are within normal limits. Venous sinuses: Not evaluated due to early contrast timing. Anatomic variants: Mildly dominant left vertebral artery. Review of the MIP images confirms the above findings IMPRESSION: 1. Positive for Left PCA occlusion. Poor enhancement of the left PCA beginning at the origin. 2. CT Perfusion detects 36 mL of ischemic penumbra in the Left PCA territory, although some of this is known to be core infarct based on the plain Head CT changes at 1047 hours today. 3. Short segment occlusion or high-grade stenosis of the Right Vertebral Artery origin. But reconstituted enhancement in the right V1 segment and otherwise normal vertebral arteries and basilar. 4. Mild-to-moderate Right PCA atherosclerosis and stenosis, P1/P2 junction. 5. Bilateral carotid atherosclerosis with up to moderate left ICA siphon stenosis but no stenosis in the neck. Study discussed by telephone with Dr. Milon Dikes on 06/07/2018 at 11:37 . Electronically Signed   By: Odessa Fleming M.D.   On: 06/08/2018 11:37   Dg Chest Port 1 View  Result Date: June 28, 2018 CLINICAL DATA:  Respiratory insufficiency. EXAM: PORTABLE CHEST 1 VIEW COMPARISON:  06/22/2018 FINDINGS: The endotracheal tube is in good position at the mid tracheal level. The NG  tube is coursing down the esophagus and into the stomach. The heart remains enlarged with prominent mediastinum extension rated by the supine position and AP projection. Persistent edema and atelectasis. Persistent left basilar process possibly a combination of atelectasis and infiltrate. Slight improved overall lung aeration. IMPRESSION: 1. Stable support apparatus.  Interval addition of an NG tube. 2. Persistent edema and atelectasis but no definite pleural effusions. Overall slight improved lung aeration. 3. Persistent left basilar density, possibly a combination of atelectasis and infiltrate. Electronically Signed   By: Rudie Meyer M.D.   On: 06-28-18 08:34   Dg Chest Port 1 View  Result Date: 06/17/2018 CLINICAL DATA:  Stroke. Cough. EXAM: PORTABLE CHEST 1 VIEW COMPARISON:  04/27/2005 FINDINGS: Endotracheal tube terminates approximately 2 cm above the carina. The cardiac silhouette is mildly enlarged. Lung volumes are low with increased interstitial markings throughout both lungs as well as patchy bilateral perihilar opacity. Focal curvilinear opacity in the lateral left mid lung suggests atelectasis. No sizable pleural effusion or pneumothorax is identified. No acute osseous abnormality is seen. IMPRESSION: 1. Endotracheal tube as above. 2. Low lung volumes with bilateral interstitial and perihilar lung opacities which could reflect edema or infection. Electronically Signed   By: Sebastian Ache M.D.   On: 06/28/2018 16:22   Dg Abd Portable 1v  Result Date: 06/17/2018 CLINICAL DATA:  OG tube placement EXAM: PORTABLE ABDOMEN - 1 VIEW COMPARISON:  None. FINDINGS: Orogastric tube tip and side port project over the stomach. The tip is near the gastroduodenal junction. There is excreted contrast material in the urinary bladder and renal pelvises. IMPRESSION: OG tube tip and side port projecting over the stomach. Electronically Signed   By: Deatra Robinson M.D.   On: 06/17/2018 22:31   Ct Head Code Stroke  Wo Contrast  Result Date: 06/16/2018 CLINICAL DATA:  Code stroke. 67 year old female with right side weakness. EXAM: CT HEAD WITHOUT CONTRAST TECHNIQUE: Contiguous axial images  were obtained from the base of the skull through the vertex without intravenous contrast. COMPARISON:  None. FINDINGS: Brain: Frontal lobe volume loss bilaterally (coronal image 30). Lacunar infarcts in the left corona radiata and right caudate appear chronic. Age indeterminate left lateral thalamus lacune (series 3, image 18). Additionally there is a 3 centimeter area of cytotoxic edema in the left occipital lobe (also on image 18). No associated hemorrhage or mass effect. No other cytotoxic edema identified. No midline shift, ventriculomegaly, mass effect, evidence of mass lesion, intracranial hemorrhage. No chronic cortical encephalomalacia identified. Vascular: Calcified atherosclerosis at the skull base. Skull: Negative, hyperostosis (normal variant). Sinuses/Orbits: Visualized paranasal sinuses and mastoids are well pneumatized. Other: Visualized orbit soft tissues are within normal limits. No acute scalp soft tissue findings. ASPECTS [facility] Stroke Program Early CT Score) - Ganglionic level infarction (caudate, lentiform nuclei, internal capsule, insula, M1-M3 cortex): 7 - Supraganglionic infarction (M4-M6 cortex): 3 Total score (0-10 with 10 being normal): 10 (left PCA territory abnormalities). IMPRESSION: 1. Acute to subacute appearing left PCA territory infarct with probable involvement of the left thalamus in this clinical setting. No associated hemorrhage or mass effect. 2. 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 3. The nonspecific bilateral frontal lobe atrophy. 4. ASPECTS is 10. 5. These results were communicated to Dr. Wilford Corner at 10:57 amon 11/25/2019by text page via the [facility] messaging system. Electronically Signed   By: Odessa Fleming M.D.   On: 06/19/2018 10:58     Labs:  CBC: Recent Labs    06/16/2018 1045 06/19/2018 1047 06/12/18 0348 Jul 11, 2018 0453  WBC 7.2  --  14.1* 12.7*  HGB 14.4 15.6* 13.3 12.5  HCT 46.1* 46.0 42.2 38.2  PLT 260  --  268 249    COAGS: Recent Labs    06/12/2018 1045  INR 0.95  APTT 28    BMP: Recent Labs    06/10/2018 1045 06/20/2018 1047 06/12/18 0348 07/11/18 0453  NA 130* 132* 132* 134*  K 4.3 4.3 4.4 4.3  CL 98 98 105 107  CO2 23  --  18* 21*  GLUCOSE 293* 287* 235* 223*  BUN 9 10 15 19   CALCIUM 8.8*  --  8.3* 8.5*  CREATININE 0.73 0.60 1.36* 0.77  GFRNONAA >60  --  39* >60  GFRAA >60  --  45* >60    LIVER FUNCTION TESTS: Recent Labs    06/10/2018 1045  BILITOT 0.6  AST 21  ALT 22  ALKPHOS 66  PROT 6.7  ALBUMIN 3.3*    Assessment and Plan:  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 Corliss Skains.  Patient remains intubated and sedated, no meaningful change from yesterday. Per chart family has decided to extubate patient and begin comfort measures.  Electronically Signed: Villa Herb, PA-C 2018/07/11, 4:15 PM   I spent a total of 15 Minutes at the the patient's bedside AND on the patient's hospital floor or unit, greater than 50% of which was counseling/coordinating care for follow up mechanical thrombectomy and stent assisted angioplasty.

## 2018-06-30 NOTE — Progress Notes (Signed)
STROKE TEAM PROGRESS NOTE   INTERVAL HISTORY Her  Son and daughter in law are   at the bedside.   Patient became agitated this morning requiring deep sedation. She's not been able to wean off ventilatory support.   Vitals:   06/05/2018 0800 06/14/2018 0849 06/19/2018 1200 06/23/2018 1500  BP: (!) 102/55     Pulse: 82     Resp: 11     Temp: 99.8 F (37.7 C)  (!) 100.9 F (38.3 C)   TempSrc: Axillary  Axillary   SpO2: 100% 100%    Weight:    133.3 kg  Height:    5\' 5"  (1.651 m)    CBC:  Recent Labs  Lab 06-28-2018 1045  06/12/18 0348 06/22/2018 0453  WBC 7.2  --  14.1* 12.7*  NEUTROABS 4.8  --  11.5*  --   HGB 14.4   < > 13.3 12.5  HCT 46.1*   < > 42.2 38.2  MCV 91.7  --  91.7 92.9  PLT 260  --  268 249   < > = values in this interval not displayed.    Basic Metabolic Panel:  Recent Labs  Lab 06/12/18 0348  06/12/18 1712 06/20/2018 0453  NA 132*  --   --  134*  K 4.4  --   --  4.3  CL 105  --   --  107  CO2 18*  --   --  21*  GLUCOSE 235*  --   --  223*  BUN 15  --   --  19  CREATININE 1.36*  --   --  0.77  CALCIUM 8.3*  --   --  8.5*  MG  --    < > 2.0 2.1  PHOS  --    < > 3.3 3.1   < > = values in this interval not displayed.   Lipid Panel:     Component Value Date/Time   CHOL 212 (H) 06/12/2018 0348   TRIG 189 (H) 06/12/2018 0348   HDL 39 (L) 06/12/2018 0348   CHOLHDL 5.4 06/12/2018 0348   VLDL 38 06/12/2018 0348   LDLCALC 135 (H) 06/12/2018 0348   HgbA1c:  Lab Results  Component Value Date   HGBA1C 10.1 (H) 06/12/2018   Urine Drug Screen:     Component Value Date/Time   LABOPIA NONE DETECTED 06/12/2018 0348   COCAINSCRNUR NONE DETECTED 06/12/2018 0348   LABBENZ NONE DETECTED 06/12/2018 0348   AMPHETMU NONE DETECTED 06/12/2018 0348   THCU NONE DETECTED 06/12/2018 0348   LABBARB NONE DETECTED 06/12/2018 0348    Alcohol Level     Component Value Date/Time   ETH <10 2018-06-28 1045    IMAGING Mr Brain Wo Contrast  Result Date: 06/12/2018 CLINICAL  DATA:  Left PCA occlusion status post mechanical thrombectomy and rescue stent assisted angioplasty. EXAM: MRI HEAD WITHOUT CONTRAST TECHNIQUE: Multiplanar, multiecho pulse sequences of the brain and surrounding structures were obtained without intravenous contrast. COMPARISON:  Head CT 28-Jun-2018 FINDINGS: Brain: There is an acute large left PCA territory infarct including involvement of the majority of the left thalamus. There is also an acute infarct involving much of the left midbrain, and there is a small acute infarct in the right paracentral pons. Punctate acute infarcts are present in the left cerebellum, mesial right temporal lobe, and right occipital lobe. There is petechial hemorrhage associated with the acute left PCA infarct without malignant hemorrhagic transformation. Cytotoxic edema results in mild mass effect on  the left lateral ventricle. There is no midline shift or extra-axial fluid collection. Chronic lacunar infarcts are noted in the basal ganglia bilaterally, right thalamus, left corona radiata. Cerebral atrophy involves the frontal greater than parietal lobes. Vascular: Major intracranial vascular flow voids are preserved. Skull and upper cervical spine: Unremarkable bone marrow signal. Sinuses/Orbits: Unremarkable orbits. Scattered small volume fluid in the paranasal sinuses. At most trace fluid in the mastoid air cells. Other: None. IMPRESSION: 1. Large acute left PCA infarct. 2. Smaller acute infarcts in the brainstem, cerebellum, and right PCA territory. 3. Chronic lacunar infarcts as above. Electronically Signed   By: Sebastian Ache M.D.   On: 06/12/2018 10:29   Dg Chest Port 1 View  Result Date: 2018/06/26 CLINICAL DATA:  Respiratory insufficiency. EXAM: PORTABLE CHEST 1 VIEW COMPARISON:  06/29/2018 FINDINGS: The endotracheal tube is in good position at the mid tracheal level. The NG tube is coursing down the esophagus and into the stomach. The heart remains enlarged with prominent  mediastinum extension rated by the supine position and AP projection. Persistent edema and atelectasis. Persistent left basilar process possibly a combination of atelectasis and infiltrate. Slight improved overall lung aeration. IMPRESSION: 1. Stable support apparatus.  Interval addition of an NG tube. 2. Persistent edema and atelectasis but no definite pleural effusions. Overall slight improved lung aeration. 3. Persistent left basilar density, possibly a combination of atelectasis and infiltrate. Electronically Signed   By: Rudie Meyer M.D.   On: 06-26-2018 08:34   Dg Abd Portable 1v  Result Date: 06/12/2018 CLINICAL DATA:  OG tube placement EXAM: PORTABLE ABDOMEN - 1 VIEW COMPARISON:  None. FINDINGS: Orogastric tube tip and side port project over the stomach. The tip is near the gastroduodenal junction. There is excreted contrast material in the urinary bladder and renal pelvises. IMPRESSION: OG tube tip and side port projecting over the stomach. Electronically Signed   By: Deatra Robinson M.D.   On: 06/17/2018 22:31   Cerebral angiogram  S/P bilateral vert artery angiograms and Lt common carotid arteriogram followed by partial recanalization of occluded Lt PCA prox with x 1 pass with 33mm embotrap retriever device achieving a TICI 2b revascularization followed by rescue stenting of reocclusion of the LT PCA  Due to underlying tandem atherosclerotic stenosis . Occlusion within the stent partially recanalized with 9mm of superselective  IA integrelin and x1 balloon angioplasty.   PHYSICAL EXAM  Patient is intubated and sedated . Afebrile. Head is nontraumatic. Neck is supple without bruit.    Cardiac exam no murmur or gallop. Lungs are clear to auscultation. Distal pulses are well felt. Neurological Exam :  intubated, sedated.she does not follow any commands. Eyes are closed. Pupils 4 mm sluggishly reactive. Left gaze preference. Doll's eye movements sluggish. Does not blink to threat  bilaterally. Mild right lower facial asymmetry. Tongue midline.she has dense right hemiplegia and will not withdraw to pain in the upper extremity but does show partial in the right. She has brisk withdrawal to pain in the left upper and lower extremity. ASSESSMENT/PLAN Ms. Jessica Hoffman is a 67 y.o. female with history of hypertension, diabetes presenting with right-sided numbness.   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  Old left corona radiata and right caudate infarcts.  Nonspecific bilateral frontal lobe atrophy.  ASPECTS 10.     CTA head & neck positive L PCA occlusion.  Short segment occlusion R VA origin.  Mild to moderate R PCA atherosclerosis and stenosis P1/2 junction.  The ICA atherosclerosis with moderate L ICA siphon stenosis but none in the neck.  CT perfusion 36 mL penumbra left PCA some is core infarct based on plain CT  Post IR CT no hemorrhage.  Acute left occipital infarct  MRI large left PCA infarct.  Smaller L>R brainstem, cerebellum and right PCA territory infarcts.  Chronically [place].  2D Echo  Mild concentric hypertrophy with ejection fraction 60-65%. No regional wall motion abnormalities.  LDL 135  HgbA1c 10.1  SCDs for VTE prophylaxis  Diet Order            Diet NPO time specified  Diet effective now              No antithrombotic prior to admission, now on aspirin 81 mg daily and Brilinta 90 bid. Continue at d/c  Therapy recommendations:  pending   Disposition:  pending   Acute Respiratory Failure  Intubated  On fentanyl and precedex  CCM on board  Dysphagia secondary to stroke  Diet Order            Diet NPO time specified  Diet effective now             started on  TF  Hypertension  Blood pressure management per neuro interventional etiologies x24 hours post IR   Initially on cleviprex changed to cardene d/t increased triglycerides   Hyperlipidemia  Home meds:  No statin  LDL 135, goal < 70  Add statin prior to discharge based on plan of care  Diabetes type II  HgbA1c 10.1, goal < 7.0  Uncontrolled  CBGs  SSI  levemir 5 bid  Other Stroke Risk Factors  Advanced age  Cigarette smoker  ETOH use  Morbid Obesity, Body mass index is 48.9 kg/m., recommend weight loss, diet and exercise as appropriate   Other Active Problems  AKI, Cr 0.6->1.36. On IVF  Leukocytosis, 14.1 without fever, likely reactive  Per triglyceridemia improved off propofol and clevipine  Hospital day # 2   she presented with right hemiplegia and vision loss due to left P1 occlusion and underwent emergent thrombectomy with revascularization but MRI scan shows complete territory left PCA infarct as well as bilateral brainstem and cerebellar infarcts. She is likely to have significant neurological deficits and may need prolonged ventilatory support and feeding tube. I had a long discussion with the patient's son at the bedside and spoke to her daughter over the phone as well they both were in agreement that patient would not want prolonged ventilatory support or feeding tube given the fact that she was likely to have significant neurological disability if she survives. She had made quite clear to the family that she would not want to be kept alive if she has significant disability. The family agrees to DO NOT RESUSCITATE and comfort care and withdrawal of ventilatory support. Discussed with Dr. Denese Killings . This patient is critically ill and at significant risk of neurological worsening, death and care requires constant monitoring of vital signs, hemodynamics,respiratory and cardiac monitoring, extensive review of multiple databases, frequent neurological assessment,  discussion with family, other specialists and medical decision making of high complexity.I have made any additions or clarifications directly to the above note.This critical care time does not reflect procedure time, or teaching time or  supervisory time of PA/NP/Med Resident etc but could involve care discussion time.  I spent 40 minutes of neurocritical care time  in the care of  this patient.     Delia HeadyPramod Eilleen Davoli, MD Medical Director [facility] Pager: [phone] 2018/05/21 5:52 PM  To contact Stroke Continuity provider, please refer to [email or web address]. After hours, contact General Neurology

## 2018-06-30 NOTE — Progress Notes (Signed)
Nutrition Brief Note  Chart reviewed. Pt now transitioning to comfort care.  No further nutrition interventions warranted at this time.  Please re-consult as needed.   Abelino Tippin RD, LDN, CNSC [phone] Pager [phone] After Hours Pager    

## 2018-06-30 NOTE — Procedures (Signed)
Extubation Procedure Note  Patient Details:   Name: Jessica Hoffman DOB: 12/13/1950 MRN: [id number]   Airway Documentation:  Airway 7.5 mm (Active)  Secured at (cm) 23 cm 11-25-2017  7:47 AM  Measured From Lips 11-25-2017  7:47 AM  Secured Location Left 11-25-2017  7:47 AM  Secured By [employer] Tube Holder 11-25-2017  7:47 AM  Tube Holder Repositioned Yes 11-25-2017  7:47 AM  Cuff Pressure (cm H2O) 29 cm H2O 11-25-2017  7:47 AM  Site Condition Dry 11-25-2017  7:47 AM   Vent end date: (not recorded) Vent end time: (not recorded)   Evaluation  O2 sats: stable throughout Complications: No apparent complications Patient did tolerate procedure well. Bilateral Breath Sounds: Diminished, Rhonchi   No  Patient extubated with a comfort care orders RN at bedside with RT  Kara PacerElisha  Ghali Morissette 11-25-2017, 1:00 PM

## 2018-06-30 NOTE — Death Summary Note (Signed)
DEATH SUMMARY   Patient Details  Name: Jessica Hoffman MRN: [id number] DOB: [birth year]  Admission/Discharge Information   Admit Date:  06/28/18  Date of Death: Date of Death: Jun 30, 2018  Time of Death: Time of Death: 12/23/1447  Length of Stay: 2  Referring Physician: System, Pcp Not In   Reason(s) for Hospitalization  Acute cerebrovascular event  Diagnoses  Preliminary cause of death:   Cerebral infarction. Secondary Diagnoses (including complications and co-morbidities):  Active Problems:   Acute ischemic stroke [facility])   Acute ischemic left PCA stroke [facility])   Brief Hospital Course (including significant findings, care, treatment, and services provided and events leading to death)  Jessica Hoffman is a 67 y.o. year old female who presented with right sided numbness and hypertension. She was found to have a visual field cut, and right-sided weakness. CT showed evolving left PCA infarct with thalamic involvement.  She was brought for intervention but remained hemiplegic on the right side. Follow-up CT showed completed left PCA and midbrain infarct. Thus her prognosis for recovery to independent living was deemed to be low and the family did not believed that this would be compatible with her wishes indicating that she already had a fairly poor quality of life.  They therefore opted for compassionate extubation and the patient passed away peacefully.   Pertinent Labs and Studies  Significant Diagnostic Studies Ct Angio Head W Or Wo Contrast  Result Date: 2018-06-28 CLINICAL DATA:  67 year old female with right side weakness. Acute to subacute appearing left PCA territory ischemia on head CT today. EXAM: CT ANGIOGRAPHY HEAD AND NECK CT PERFUSION BRAIN TECHNIQUE: Multidetector CT imaging of the head and neck was performed using the standard protocol during bolus administration of intravenous contrast. Multiplanar CT image reconstructions and MIPs were obtained to evaluate the vascular anatomy.  Carotid stenosis measurements (when applicable) are obtained utilizing NASCET criteria, using the distal internal carotid diameter as the denominator. Multiphase CT imaging of the brain was performed following IV bolus contrast injection. Subsequent parametric perfusion maps were calculated using RAPID software. CONTRAST:  50mL ISOVUE-370 IOPAMIDOL (ISOVUE-370) INJECTION 76% COMPARISON:  Head CT at 1047 hours. FINDINGS: CT Brain Perfusion Findings: CBF (<30%) Volume: None Perfusion (Tmax>6.0s) volume: 36mL Mismatch Volume: 36mL Infarction Location:No core infarct detected by CTP analysis. CTA NECK Skeleton: No acute osseous abnormality identified. Upper chest: Mild upper lung atelectasis and gas trapping suspected. No superior mediastinal lymphadenopathy. Other neck: Negative. Aortic arch: 3 vessel arch configuration. Little to no arch atherosclerosis. Right carotid system: Streak artifact from dense left innominate vein contrast mildly degrades detail of the proximal right CCA. Mild tortuosity. Soft and calcified plaque at the right carotid bifurcation, right ICA origin and bulb without stenosis. Left carotid system: Streak artifact from dense left innominate vein contrast degrades detail of the proximal left CCA. Mild soft and calcified plaque at the left ICA origin and bulb without stenosis. Vertebral arteries: The proximal right subclavian artery appears normal but there is high-grade stenosis or short segment occlusion of the right vertebral artery origin best seen on series 9, image 176. The distal right V1 segment is patent. The right vertebral artery is otherwise patent to the skull base without stenosis. The proximal left subclavian artery and left vertebral artery origin appear normal. Mildly tortuous left V1 segment. The left vertebral artery is patent to the skull base without stenosis, and appears mildly dominant. CTA HEAD Posterior circulation: Mildly dominant left V4 segment. No distal vertebral  stenosis. PICA origins are patent. Patent vertebrobasilar junction  and basilar artery without stenosis. Patent SCA origins. Mild irregularity and stenosis at the right PCA origin and P1 segment. Up to moderate stenosis at the right P1/P2 junction best demonstrated on series 11, image 22. There is preserved distal right PCA enhancement. Poor enhancement of the left PCA from its origin through the P2 segment beyond which the left PCA appears occluded. Posterior communicating arteries are diminutive or absent. Anterior circulation: Both ICA siphons are patent. Bilateral siphon calcified plaque. Mild to moderate left cavernous segment stenosis. No right ICA siphon stenosis. Normal ophthalmic artery origins. Patent carotid termini. Normal MCA and ACA origins. Anterior communicating artery and bilateral ACA branches are within normal limits. Both MCA M1 segments bifurcates early. The left M1 is mildly irregular without stenosis. Left MCA branches are within normal limits. Right MCA M1 segment is mildly irregular without stenosis. Right MCA branches are within normal limits. Venous sinuses: Not evaluated due to early contrast timing. Anatomic variants: Mildly dominant left vertebral artery. Review of the MIP images confirms the above findings IMPRESSION: 1. Positive for Left PCA occlusion. Poor enhancement of the left PCA beginning at the origin. 2. CT Perfusion detects 36 mL of ischemic penumbra in the Left PCA territory, although some of this is known to be core infarct based on the plain Head CT changes at 1047 hours today. 3. Short segment occlusion or high-grade stenosis of the Right Vertebral Artery origin. But reconstituted enhancement in the right V1 segment and otherwise normal vertebral arteries and basilar. 4. Mild-to-moderate Right PCA atherosclerosis and stenosis, P1/P2 junction. 5. Bilateral carotid atherosclerosis with up to moderate left ICA siphon stenosis but no stenosis in the neck. Study discussed by  telephone with Dr. Milon Dikes on 06/28/2018 at 11:37 . Electronically Signed   By: Odessa Fleming M.D.   On: 06/23/2018 11:37   Ct Head Wo Contrast  Result Date: 06/21/2018 CLINICAL DATA:  Status post stent placement EXAM: CT HEAD WITHOUT CONTRAST TECHNIQUE: Contiguous axial images were obtained from the base of the skull through the vertex without intravenous contrast. COMPARISON:  Head CT from earlier today FINDINGS: Brain: More well-defined left PCA territory infarct, especially along the inferior occipital lobe-see reformats. Remote lacunar infarcts in the right caudate nucleus and remote perforator infarct in the left corona radiata. No postprocedural hemorrhage. No hydrocephalus or shift. Vascular: Interval distal basilar to left PCA stent. High-density vessels after contrast load. Skull: Negative Sinuses/Orbits: Negative IMPRESSION: 1. No postprocedural hemorrhage. 2. Acute left occipital infarct that has become more confluent than on head CT earlier today. Electronically Signed   By: Marnee Spring M.D.   On: 06/24/2018 15:51   Ct Angio Neck W Or Wo Contrast  Result Date: 06/01/2018 CLINICAL DATA:  67 year old female with right side weakness. Acute to subacute appearing left PCA territory ischemia on head CT today. EXAM: CT ANGIOGRAPHY HEAD AND NECK CT PERFUSION BRAIN TECHNIQUE: Multidetector CT imaging of the head and neck was performed using the standard protocol during bolus administration of intravenous contrast. Multiplanar CT image reconstructions and MIPs were obtained to evaluate the vascular anatomy. Carotid stenosis measurements (when applicable) are obtained utilizing NASCET criteria, using the distal internal carotid diameter as the denominator. Multiphase CT imaging of the brain was performed following IV bolus contrast injection. Subsequent parametric perfusion maps were calculated using RAPID software. CONTRAST:  50mL ISOVUE-370 IOPAMIDOL (ISOVUE-370) INJECTION 76% COMPARISON:  Head CT  at 1047 hours. FINDINGS: CT Brain Perfusion Findings: CBF (<30%) Volume: None Perfusion (Tmax>6.0s) volume: 36mL Mismatch Volume:  36mL Infarction Location:No core infarct detected by CTP analysis. CTA NECK Skeleton: No acute osseous abnormality identified. Upper chest: Mild upper lung atelectasis and gas trapping suspected. No superior mediastinal lymphadenopathy. Other neck: Negative. Aortic arch: 3 vessel arch configuration. Little to no arch atherosclerosis. Right carotid system: Streak artifact from dense left innominate vein contrast mildly degrades detail of the proximal right CCA. Mild tortuosity. Soft and calcified plaque at the right carotid bifurcation, right ICA origin and bulb without stenosis. Left carotid system: Streak artifact from dense left innominate vein contrast degrades detail of the proximal left CCA. Mild soft and calcified plaque at the left ICA origin and bulb without stenosis. Vertebral arteries: The proximal right subclavian artery appears normal but there is high-grade stenosis or short segment occlusion of the right vertebral artery origin best seen on series 9, image 176. The distal right V1 segment is patent. The right vertebral artery is otherwise patent to the skull base without stenosis. The proximal left subclavian artery and left vertebral artery origin appear normal. Mildly tortuous left V1 segment. The left vertebral artery is patent to the skull base without stenosis, and appears mildly dominant. CTA HEAD Posterior circulation: Mildly dominant left V4 segment. No distal vertebral stenosis. PICA origins are patent. Patent vertebrobasilar junction and basilar artery without stenosis. Patent SCA origins. Mild irregularity and stenosis at the right PCA origin and P1 segment. Up to moderate stenosis at the right P1/P2 junction best demonstrated on series 11, image 22. There is preserved distal right PCA enhancement. Poor enhancement of the left PCA from its origin through the P2  segment beyond which the left PCA appears occluded. Posterior communicating arteries are diminutive or absent. Anterior circulation: Both ICA siphons are patent. Bilateral siphon calcified plaque. Mild to moderate left cavernous segment stenosis. No right ICA siphon stenosis. Normal ophthalmic artery origins. Patent carotid termini. Normal MCA and ACA origins. Anterior communicating artery and bilateral ACA branches are within normal limits. Both MCA M1 segments bifurcates early. The left M1 is mildly irregular without stenosis. Left MCA branches are within normal limits. Right MCA M1 segment is mildly irregular without stenosis. Right MCA branches are within normal limits. Venous sinuses: Not evaluated due to early contrast timing. Anatomic variants: Mildly dominant left vertebral artery. Review of the MIP images confirms the above findings IMPRESSION: 1. Positive for Left PCA occlusion. Poor enhancement of the left PCA beginning at the origin. 2. CT Perfusion detects 36 mL of ischemic penumbra in the Left PCA territory, although some of this is known to be core infarct based on the plain Head CT changes at 1047 hours today. 3. Short segment occlusion or high-grade stenosis of the Right Vertebral Artery origin. But reconstituted enhancement in the right V1 segment and otherwise normal vertebral arteries and basilar. 4. Mild-to-moderate Right PCA atherosclerosis and stenosis, P1/P2 junction. 5. Bilateral carotid atherosclerosis with up to moderate left ICA siphon stenosis but no stenosis in the neck. Study discussed by telephone with Dr. Milon DikesASHISH ARORA on 06/23/2018 at 11:37 . Electronically Signed   By: Odessa FlemingH  Hall M.D.   On: 06/14/2018 11:37   Mr Brain Wo Contrast  Result Date: 06/12/2018 CLINICAL DATA:  Left PCA occlusion status post mechanical thrombectomy and rescue stent assisted angioplasty. EXAM: MRI HEAD WITHOUT CONTRAST TECHNIQUE: Multiplanar, multiecho pulse sequences of the brain and surrounding  structures were obtained without intravenous contrast. COMPARISON:  Head CT 06/16/2018 FINDINGS: Brain: There is an acute large left PCA territory infarct including involvement of the majority of  the left thalamus. There is also an acute infarct involving much of the left midbrain, and there is a small acute infarct in the right paracentral pons. Punctate acute infarcts are present in the left cerebellum, mesial right temporal lobe, and right occipital lobe. There is petechial hemorrhage associated with the acute left PCA infarct without malignant hemorrhagic transformation. Cytotoxic edema results in mild mass effect on the left lateral ventricle. There is no midline shift or extra-axial fluid collection. Chronic lacunar infarcts are noted in the basal ganglia bilaterally, right thalamus, left corona radiata. Cerebral atrophy involves the frontal greater than parietal lobes. Vascular: Major intracranial vascular flow voids are preserved. Skull and upper cervical spine: Unremarkable bone marrow signal. Sinuses/Orbits: Unremarkable orbits. Scattered small volume fluid in the paranasal sinuses. At most trace fluid in the mastoid air cells. Other: None. IMPRESSION: 1. Large acute left PCA infarct. 2. Smaller acute infarcts in the brainstem, cerebellum, and right PCA territory. 3. Chronic lacunar infarcts as above. Electronically Signed   By: Sebastian Ache M.D.   On: 06/12/2018 10:29   Ct Cerebral Perfusion W Contrast  Result Date: 06/03/2018 CLINICAL DATA:  67 year old female with right side weakness. Acute to subacute appearing left PCA territory ischemia on head CT today. EXAM: CT ANGIOGRAPHY HEAD AND NECK CT PERFUSION BRAIN TECHNIQUE: Multidetector CT imaging of the head and neck was performed using the standard protocol during bolus administration of intravenous contrast. Multiplanar CT image reconstructions and MIPs were obtained to evaluate the vascular anatomy. Carotid stenosis measurements (when applicable)  are obtained utilizing NASCET criteria, using the distal internal carotid diameter as the denominator. Multiphase CT imaging of the brain was performed following IV bolus contrast injection. Subsequent parametric perfusion maps were calculated using RAPID software. CONTRAST:  50mL ISOVUE-370 IOPAMIDOL (ISOVUE-370) INJECTION 76% COMPARISON:  Head CT at 1047 hours. FINDINGS: CT Brain Perfusion Findings: CBF (<30%) Volume: None Perfusion (Tmax>6.0s) volume: 36mL Mismatch Volume: 36mL Infarction Location:No core infarct detected by CTP analysis. CTA NECK Skeleton: No acute osseous abnormality identified. Upper chest: Mild upper lung atelectasis and gas trapping suspected. No superior mediastinal lymphadenopathy. Other neck: Negative. Aortic arch: 3 vessel arch configuration. Little to no arch atherosclerosis. Right carotid system: Streak artifact from dense left innominate vein contrast mildly degrades detail of the proximal right CCA. Mild tortuosity. Soft and calcified plaque at the right carotid bifurcation, right ICA origin and bulb without stenosis. Left carotid system: Streak artifact from dense left innominate vein contrast degrades detail of the proximal left CCA. Mild soft and calcified plaque at the left ICA origin and bulb without stenosis. Vertebral arteries: The proximal right subclavian artery appears normal but there is high-grade stenosis or short segment occlusion of the right vertebral artery origin best seen on series 9, image 176. The distal right V1 segment is patent. The right vertebral artery is otherwise patent to the skull base without stenosis. The proximal left subclavian artery and left vertebral artery origin appear normal. Mildly tortuous left V1 segment. The left vertebral artery is patent to the skull base without stenosis, and appears mildly dominant. CTA HEAD Posterior circulation: Mildly dominant left V4 segment. No distal vertebral stenosis. PICA origins are patent. Patent  vertebrobasilar junction and basilar artery without stenosis. Patent SCA origins. Mild irregularity and stenosis at the right PCA origin and P1 segment. Up to moderate stenosis at the right P1/P2 junction best demonstrated on series 11, image 22. There is preserved distal right PCA enhancement. Poor enhancement of the left PCA from its origin  through the P2 segment beyond which the left PCA appears occluded. Posterior communicating arteries are diminutive or absent. Anterior circulation: Both ICA siphons are patent. Bilateral siphon calcified plaque. Mild to moderate left cavernous segment stenosis. No right ICA siphon stenosis. Normal ophthalmic artery origins. Patent carotid termini. Normal MCA and ACA origins. Anterior communicating artery and bilateral ACA branches are within normal limits. Both MCA M1 segments bifurcates early. The left M1 is mildly irregular without stenosis. Left MCA branches are within normal limits. Right MCA M1 segment is mildly irregular without stenosis. Right MCA branches are within normal limits. Venous sinuses: Not evaluated due to early contrast timing. Anatomic variants: Mildly dominant left vertebral artery. Review of the MIP images confirms the above findings IMPRESSION: 1. Positive for Left PCA occlusion. Poor enhancement of the left PCA beginning at the origin. 2. CT Perfusion detects 36 mL of ischemic penumbra in the Left PCA territory, although some of this is known to be core infarct based on the plain Head CT changes at 1047 hours today. 3. Short segment occlusion or high-grade stenosis of the Right Vertebral Artery origin. But reconstituted enhancement in the right V1 segment and otherwise normal vertebral arteries and basilar. 4. Mild-to-moderate Right PCA atherosclerosis and stenosis, P1/P2 junction. 5. Bilateral carotid atherosclerosis with up to moderate left ICA siphon stenosis but no stenosis in the neck. Study discussed by telephone with Dr. Milon Dikes on  06/08/2018 at 11:37 . Electronically Signed   By: Odessa Fleming M.D.   On: 06/09/2018 11:37   Dg Chest Port 1 View  Result Date: 05-Jul-2018 CLINICAL DATA:  Respiratory insufficiency. EXAM: PORTABLE CHEST 1 VIEW COMPARISON:  06/12/2018 FINDINGS: The endotracheal tube is in good position at the mid tracheal level. The NG tube is coursing down the esophagus and into the stomach. The heart remains enlarged with prominent mediastinum extension rated by the supine position and AP projection. Persistent edema and atelectasis. Persistent left basilar process possibly a combination of atelectasis and infiltrate. Slight improved overall lung aeration. IMPRESSION: 1. Stable support apparatus.  Interval addition of an NG tube. 2. Persistent edema and atelectasis but no definite pleural effusions. Overall slight improved lung aeration. 3. Persistent left basilar density, possibly a combination of atelectasis and infiltrate. Electronically Signed   By: Rudie Meyer M.D.   On: 05-Jul-2018 08:34   Dg Chest Port 1 View  Result Date: 06/27/2018 CLINICAL DATA:  Stroke. Cough. EXAM: PORTABLE CHEST 1 VIEW COMPARISON:  04/27/2005 FINDINGS: Endotracheal tube terminates approximately 2 cm above the carina. The cardiac silhouette is mildly enlarged. Lung volumes are low with increased interstitial markings throughout both lungs as well as patchy bilateral perihilar opacity. Focal curvilinear opacity in the lateral left mid lung suggests atelectasis. No sizable pleural effusion or pneumothorax is identified. No acute osseous abnormality is seen. IMPRESSION: 1. Endotracheal tube as above. 2. Low lung volumes with bilateral interstitial and perihilar lung opacities which could reflect edema or infection. Electronically Signed   By: Sebastian Ache M.D.   On: 06/04/2018 16:22   Dg Abd Portable 1v  Result Date: 06/16/2018 CLINICAL DATA:  OG tube placement EXAM: PORTABLE ABDOMEN - 1 VIEW COMPARISON:  None. FINDINGS: Orogastric tube tip and  side port project over the stomach. The tip is near the gastroduodenal junction. There is excreted contrast material in the urinary bladder and renal pelvises. IMPRESSION: OG tube tip and side port projecting over the stomach. Electronically Signed   By: Deatra Robinson M.D.   On: 06/08/2018 22:31  Ct Head Code Stroke Wo Contrast  Result Date: 06/16/2018 CLINICAL DATA:  Code stroke. 67 year old female with right side weakness. EXAM: CT HEAD WITHOUT CONTRAST TECHNIQUE: Contiguous axial images were obtained from the base of the skull through the vertex without intravenous contrast. COMPARISON:  None. FINDINGS: Brain: Frontal lobe volume loss bilaterally (coronal image 30). Lacunar infarcts in the left corona radiata and right caudate appear chronic. Age indeterminate left lateral thalamus lacune (series 3, image 18). Additionally there is a 3 centimeter area of cytotoxic edema in the left occipital lobe (also on image 18). No associated hemorrhage or mass effect. No other cytotoxic edema identified. No midline shift, ventriculomegaly, mass effect, evidence of mass lesion, intracranial hemorrhage. No chronic cortical encephalomalacia identified. Vascular: Calcified atherosclerosis at the skull base. Skull: Negative, hyperostosis (normal variant). Sinuses/Orbits: Visualized paranasal sinuses and mastoids are well pneumatized. Other: Visualized orbit soft tissues are within normal limits. No acute scalp soft tissue findings. ASPECTS [facility] Stroke Program Early CT Score) - Ganglionic level infarction (caudate, lentiform nuclei, internal capsule, insula, M1-M3 cortex): 7 - Supraganglionic infarction (M4-M6 cortex): 3 Total score (0-10 with 10 being normal): 10 (left PCA territory abnormalities). IMPRESSION: 1. Acute to subacute appearing left PCA territory infarct with probable involvement of the left thalamus in this clinical setting. No associated hemorrhage or mass effect. 2. 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 3. The nonspecific bilateral frontal lobe atrophy. 4. ASPECTS is 10. 5. These results were communicated to Dr. Wilford Corner at 10:57 amon 11/05/2019by text page via the [facility] messaging system. Electronically Signed   By: Odessa Fleming M.D.   On: 06/06/2018 10:58    Microbiology Recent Results (from the past 240 hour(s))  MRSA PCR Screening     Status: None   Collection Time: 06/26/2018  6:32 PM  Result Value Ref Range Status   MRSA by PCR NEGATIVE NEGATIVE Final    Comment:        The GeneXpert MRSA Assay (FDA approved for NASAL specimens only), is one component of a comprehensive MRSA colonization surveillance program. It is not intended to diagnose MRSA infection nor to guide or monitor treatment for MRSA infections. Performed at [facility], [address], [place], [place] [address]     Lab Basic Metabolic Panel: Recent Labs  Lab 06/24/2018 1045 06/08/2018 1047 06/12/18 0348 06/12/18 1426 06/12/18 1712 06-14-2018 0453  NA 130* 132* 132*  --   --  134*  K 4.3 4.3 4.4  --   --  4.3  CL 98 98 105  --   --  107  CO2 23  --  18*  --   --  21*  GLUCOSE 293* 287* 235*  --   --  223*  BUN 9 10 15   --   --  19  CREATININE 0.73 0.60 1.36*  --   --  0.77  CALCIUM 8.8*  --  8.3*  --   --  8.5*  MG  --   --   --  2.0 2.0 2.1  PHOS  --   --   --  4.0 3.3 3.1   Liver Function Tests: Recent Labs  Lab 06/25/2018 1045  AST 21  ALT 22  ALKPHOS 66  BILITOT 0.6  PROT 6.7  ALBUMIN 3.3*   No results for input(s): LIPASE, AMYLASE in the last 168 hours. No results for input(s): AMMONIA in the last 168 hours. CBC: Recent Labs  Lab 06/14/2018  1045 06/06/2018 1047 06/12/18 0348 07/07/18 0453  WBC 7.2  --  14.1* 12.7*  NEUTROABS 4.8  --  11.5*  --   HGB 14.4 15.6* 13.3 12.5  HCT 46.1* 46.0 42.2 38.2  MCV 91.7  --  91.7 92.9  PLT 260  --  268 249   Cardiac Enzymes: No results for input(s): CKTOTAL, CKMB, CKMBINDEX, TROPONINI in the last 168  hours. Sepsis Labs: Recent Labs  Lab 06/27/2018 1045 06/12/18 0348 07-07-2018 0453  WBC 7.2 14.1* 12.7*    Procedures/Operations  Cerebral angiogram with PCA revascularization and stenting.   Richele Strand Jul 07, 2018, 3:58 PM

## 2018-06-30 NOTE — Progress Notes (Signed)
MD Pearlean BrownieSethi at bedside with pt's brother Eulogio DitchRichard Almquist. On speaker phone with sister Rosalia HammersJoy Bryant. This RN witnessed consensus among family for pt to be placed as palliative care. New orders placed.

## 2018-06-30 NOTE — Progress Notes (Signed)
NAME:  Jessica Hoffman, MRN:  [id number], DOB:  [birth year], LOS: 2 ADMISSION DATE:  13-Jun-2018, CONSULTATION DATE:  06/06/2018 REFERRING MD:  Corliss Skains  CHIEF COMPLAINT:  AMS   Brief History   Jessica Hoffman is a 67 y.o. female who was admitted 11/12 with left PCA infarct.  Taken to IR for intervention then returned to the ICU on the ventilator.  Past Medical History  HTN, DM, smoker  Significant Hospital Events   11/12 > admit.  Consults:  PCCM 11/12  Procedures:  ETT 11/12 >  IR 11/12 > b/l verterbral artery and L common carotid artery angiograms followed by partial recanalization of occluded left PCA and rescue stenting of reocclusion to the left PCA.  Significant Diagnostic Tests:  CT head 11/12 > left PCA infarct. CTA head / CT cerebral perfusion 11/12 > left PCA occlusion, occlusion or high grade stenosis of right vertebral artery, mild to mod right PCA atherosclerosis and stenosis, b/l carotid atherosclerosis. MRI brain 11/13 >  1. Large acute left PCA infarct. 2. Smaller acute infarcts in the brainstem, cerebellum, and right PCA territory. 3. Chronic lacunar infarcts as above. Echo 11/13 > mild concentric hypertrophy, EF 60-65%, no RWA, G1DD  Micro Data:  11/12 MRSA PCR >> neg  Antimicrobials:  None.   Interim history/subjective:  RN reports patient had purposeful movement in left and withdrawing to pain in RLE but developed vent dysschronchy this morning and biting ETT  requiring increased sedation, currently on precedex 0.5 and fentanyl gtt 300 mcg/hr Remains on cardene gtt at 3 mg/hr Remains afebrile Net +4.7 L  Objective:  Blood pressure (!) 151/62, pulse 85, temperature 99 F (37.2 C), temperature source Axillary, resp. rate 14, height 5\' 7"  (1.702 m), weight 133.3 kg, SpO2 100 %.    Vent Mode: PRVC FiO2 (%):  [40 %] 40 % Set Rate:  [18 bmp] 18 bmp Vt Set:  [490 mL] 490 mL PEEP:  [5 cmH20] 5 cmH20 Plateau Pressure:  [13 cmH20-31 cmH20] 31 cmH20    Intake/Output Summary (Last 24 hours) at 06/21/2018 0803 Last data filed at 06/28/2018 4098 Gross per 24 hour  Intake 2306.05 ml  Output 450 ml  Net 1856.05 ml   Filed Weights   06/03/2018 1031 06/25/2018 0453  Weight: 122.5 kg 133.3 kg    Examination: General:  Critically ill obese female on MV HEENT: MM pink/moist, ETT/ OGT, left pupil 5/nonreactive, right pupil 3/ sluggish, thick neck Neuro: sedated, minimal withdrawal to noxious stimuli on left, no movement on right  CV: RR, no  PULM: even/non-labored on full MV, intermittently dysschronchous with vent- more irregular breathing pattern, lungs bilaterally rhonchi, faint exp wheeze- mod white secretions, diminished in bases, noted high PIP and plateau of 33 JX:BJYN, +bs Extremities: warm/dry,generalized edema +2-3 BLE Skin: starting erythema and breakdown in panus and folds    Assessment & Plan:   Left PCA infarct - s/p IR intervention with partial revascularization with MRI showing left thalamus, L>R brain stem, cerebellar and right PCA infarcts.. - Stroke workup / management per neuro - frequent neuro exams - wake up assessment this am, as tolerated on MV, to get neuro assessment - PAD protocol with precedex and fentanyl prn  - BP control for SBP < 140 - starting GOC discussions  Respiratory insufficiency - due to above - Continue MV support - Attempt SBT but mentation will likely be barrier to extubation  - CXR now - add brovanna, duonebs q4 and albuterol PRN (as patient is heavy smoker  at home) - diuresis as below  Hx HTN - TTE as above with normal EF and G1DD - Continue cardene gtt for goal SBP < 140  - lasix 20 mg BID  AKI- resolved  - d/c IVF  - trend renal panel and phos - will place foley for skin breakdown and monitor UOP  Hx DM.  - SSI resistant,  - SSI to resistant - increase levemir to 6 units BID  Leukocytosis - improving, remains without fever - monitor fever curve, WBC trend  HLD - statin  per stroke team   Skin breakdown in folds and panniculus P:  Nystatin powder  WOC consult for possible interdry sheets  Rest per primary team  Best Practice:  Diet:TF Pain/Anxiety/Delirium protocol (if indicated): Precedex gtt / fentanyl gtt.  RASS goal 0 to -1/ vent synchrony  VAP protocol (if indicated): yes DVT prophylaxis: SCD's GI prophylaxis: Pantoprazole Glucose control: SSI resistant, levemir  Mobility: Bedrest Code Status: Full Family Communication: Brother, Eulogio DitchRichard Arentz [phone]) at bedside.  Patient's father is [phone] at home and not in great health.  [place]  No husband or kids.  Brother reports patient lived alone and kept to herself.  GOC approached and he believes patient would not want long term life support if needed but needs to discuss with his father and sister.   Disposition: ICU  Labs   CBC: Recent Labs  Lab 06-02-2018 1045 06-02-2018 1047 06/12/18 0348 06/20/2018 0453  WBC 7.2  --  14.1* 12.7*  NEUTROABS 4.8  --  11.5*  --   HGB 14.4 15.6* 13.3 12.5  HCT 46.1* 46.0 42.2 38.2  MCV 91.7  --  91.7 92.9  PLT 260  --  268 249   Basic Metabolic Panel: Recent Labs  Lab 06-02-2018 1045 06-02-2018 1047 06/12/18 0348 06/12/18 1426 06/12/18 1712 06/10/2018 0453  NA 130* 132* 132*  --   --   --   K 4.3 4.3 4.4  --   --   --   CL 98 98 105  --   --   --   CO2 23  --  18*  --   --   --   GLUCOSE 293* 287* 235*  --   --   --   BUN 9 10 15   --   --   --   CREATININE 0.73 0.60 1.36*  --   --   --   CALCIUM 8.8*  --  8.3*  --   --   --   MG  --   --   --  2.0 2.0 2.1  PHOS  --   --   --  4.0 3.3 3.1   GFR: Estimated Creatinine Clearance: 55.7 mL/min (A) (by C-G formula based on SCr of 1.36 mg/dL (H)). Recent Labs  Lab 06-02-2018 1045 06/12/18 0348 06/21/2018 0453  WBC 7.2 14.1* 12.7*   Liver Function Tests: Recent Labs  Lab 06-02-2018 1045  AST 21  ALT 22  ALKPHOS 66  BILITOT 0.6  PROT 6.7  ALBUMIN 3.3*   No results for  input(s): LIPASE, AMYLASE in the last 168 hours. No results for input(s): AMMONIA in the last 168 hours. ABG    Component Value Date/Time   TCO2 24 10/04/2017 1047    Coagulation Profile: Recent Labs  Lab 06-02-2018 1045  INR 0.95   Cardiac Enzymes: No results for input(s): CKTOTAL, CKMB, CKMBINDEX, TROPONINI in the last 168 hours. HbA1C: Hgb A1c MFr Bld  Date/Time Value Ref Range Status  06/12/2018 03:47 AM 10.1 (H) 4.8 - 5.6 % Final    Comment:    (NOTE) Pre diabetes:          5.7%-6.4% Diabetes:              >6.4% Glycemic control for   <7.0% adults with diabetes    CBG: Recent Labs  Lab 06/12/18 1545 06/12/18 1930 06/12/18 2357 July 06, 2018 0341 06-Jul-2018 0725  GLUCAP 164* 187* 172* 205* 189*     Critical care time: 45 mins    Posey Boyer, AGACNP-BC [facility] Pgr: [phone] or if no answer [phone] 06-Jul-2018, 8:03 AM

## 2018-06-30 NOTE — Consult Note (Signed)
WOC Nurse wound consult note Reason for Consult: skin folds Patient has recently been made comfort care, however nurse reports skin issues.  Wound type: groin wound related to previous catheter/line Mild ITD; using antifungal cream and powder  Pressure Injury POA: NA Measurement: right groin 2cm x 0.5cm x 0.1cm; 100% yellow  Wound bed: see above Drainage (amount, consistency, odor) minimal, no odor Periwound: intact  Dressing procedure/placement/frequency: At this point, conservative wound care ordered, foam to absorb moisture at the groin site.  No need for antimicrobial wicking fabric at this time. Instructed to use either cream or powder not both to avoid gooey exudate under the pannus.   Discussed POC with bedside nurse. No family in the room Re consult if needed, will not follow at this time. Thanks  Sparsh Callens [employer] MSN, RN,CWOCN, CNS, CWON-AP [phone])

## 2018-06-30 NOTE — Progress Notes (Signed)
Inpatient Diabetes Program Recommendations  AACE/ADA: New Consensus Statement on Inpatient Glycemic Control (2015)  Target Ranges:  Prepandial:   less than 140 mg/dL      Peak postprandial:   less than 180 mg/dL (1-2 hours)      Critically ill patients:  140 - 180 mg/dL   Results for Alvina ChouJOYCE, [place]amese (MRN [id number]) as of 06/01/2018 07:46  Ref. Range 26-Aug-2017 23:16 06/12/2018 03:11 06/12/2018 07:29 06/12/2018 11:20 06/12/2018 15:45 06/12/2018 19:30  Glucose-Capillary Latest Ref Range: 70 - 99 mg/dL 119282 (H)  8 units NOVOLOG  243 (H)  5 units NOVOLOG  187 (H)  3 units NOVOLOG  181 (H)  4 units NOVOLOG +  5 units LEVEMIR  164 (H)  4 units NOVOLOG  187 (H)  4 units NOVOLOG +  5 units LEVEMIR at 10pm   Results for Alvina ChouJOYCE, [place] (MRN [id number]) as of 06/26/2018 07:46  Ref. Range 06/12/2018 23:57 06/23/2018 03:41 06/12/2018 07:25  Glucose-Capillary Latest Ref Range: 70 - 99 mg/dL 130172 (H)  4 units NOVOLOG  205 (H)  7 units NOVOLOG  189 (H)   Results for Alvina ChouJOYCE, [place] (MRN [id number]) as of 06/03/2018 07:46  Ref. Range 06/12/2018 03:47  Hemoglobin A1C Latest Ref Range: 4.8 - 5.6 % 10.1 (H)    Admit Code Stroke/ HTN   History: DM, HTN  Home Meds: Unknown at this time  Current Orders: Levemir 5 units BID       Novolog Resistant Correction Scale/ SSI (0-20 units) Q4 hours      Remains Intubated.  Started Vital High protein tube feedings yesterday at 3pm--Running 40cc/hour.    MD- Please consider increasing Levemir slightly to 6 units BID (20% increase)  CBGs have improved, however, still >180 mg/dl this AM     --Will follow patient during hospitalization--  Ambrose FinlandJeannine Johnston Marabelle Cushman RN, MSN, CDE Diabetes Coordinator Inpatient Glycemic Control Team Team Pager: [phone] (8a-5p)

## 2018-06-30 NOTE — Progress Notes (Signed)
PT Cancellation Note  Patient Details Name: Jessica ChouMelinda Hoffman MRN: [id number] DOB: 07/14/1951   Cancelled Treatment:    Reason Eval/Treat Not Completed: Patient not medically ready(pt remains on vent)   [name] 05/31/2018, 6:51 AM  Delaney MeigsMaija Tabor [name], PT Acute Rehabilitation Services Pager: [phone] Office: [phone]

## 2018-06-30 DEATH — deceased

## 2020-03-22 IMAGING — DX DG CHEST 1V PORT
1 series · 1 of 1 positions shown · non-contrast
Comparison: 06/11/2018

CLINICAL DATA: Respiratory insufficiency.

EXAM:
PORTABLE CHEST 1 VIEW

[chest]
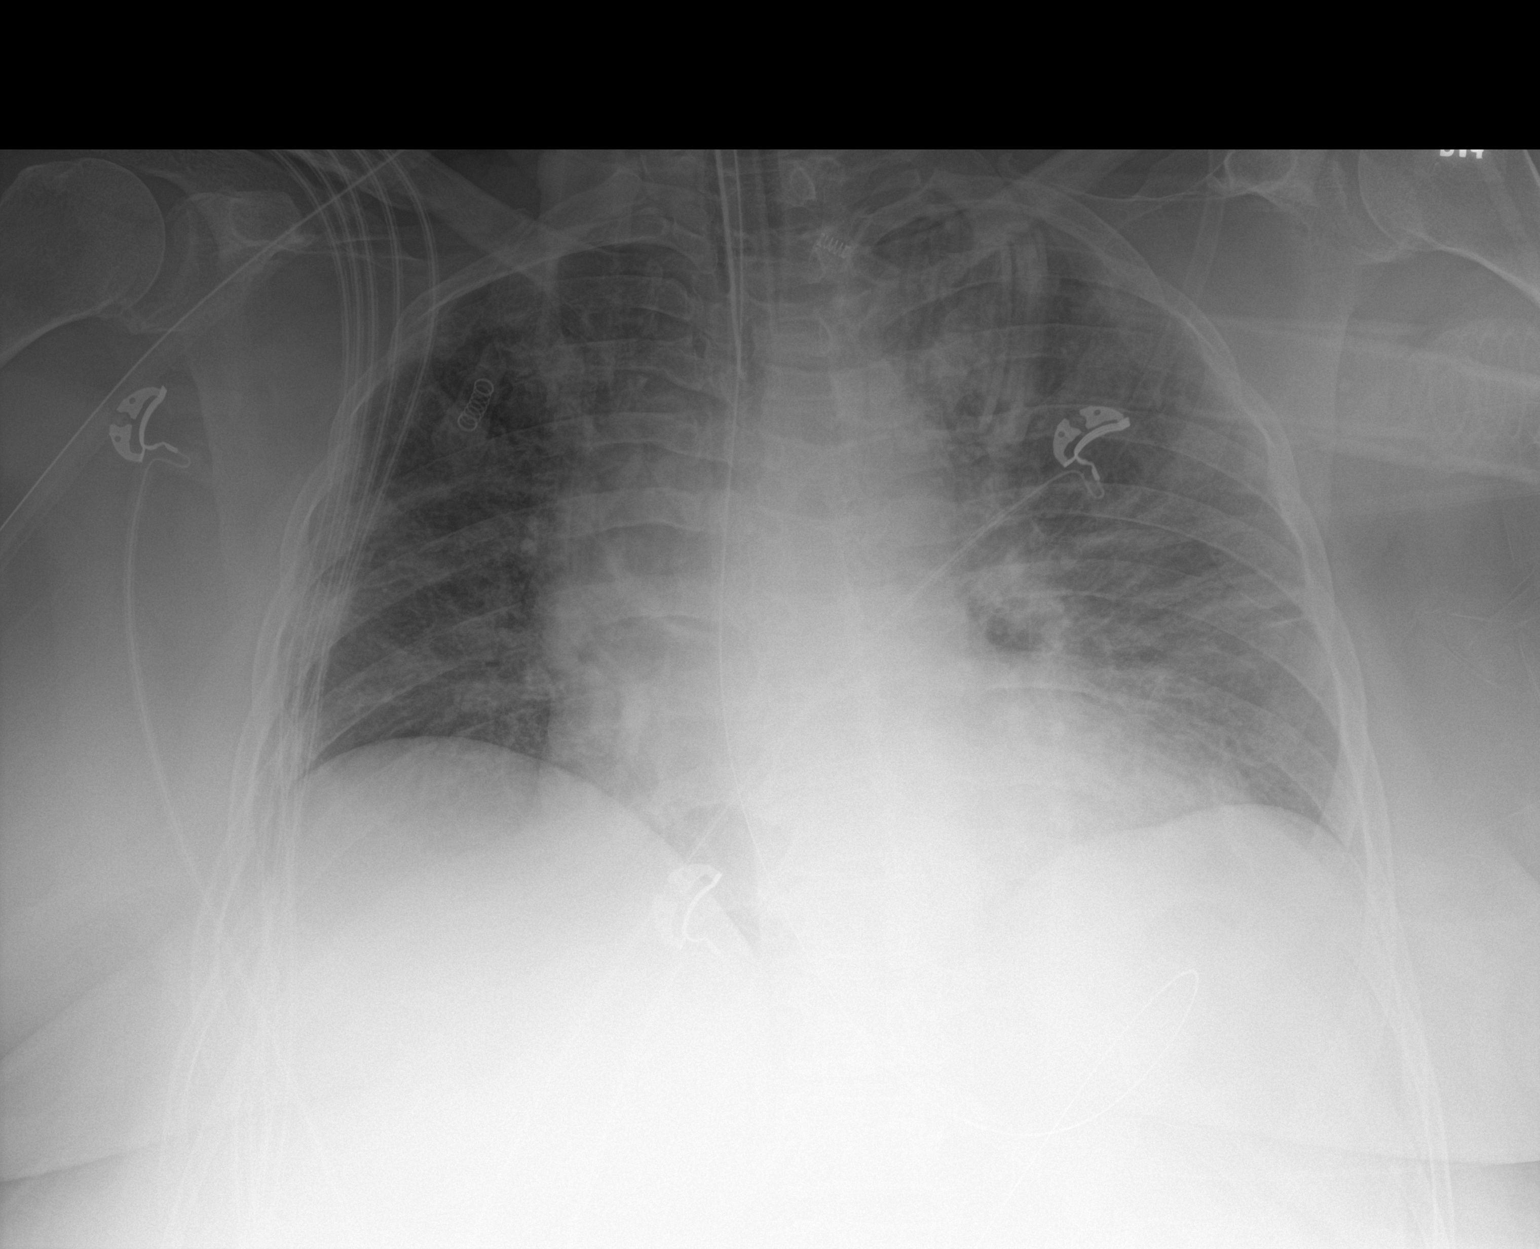

[1 of 1 positions shown; findings below may reference images not displayed]

FINDINGS: The endotracheal tube is in good position at the mid tracheal level.
The NG tube is coursing down the esophagus and into the stomach.

The heart remains enlarged with prominent mediastinum extension
rated by the supine position and AP projection.

Persistent edema and atelectasis. Persistent left basilar process
possibly a combination of atelectasis and infiltrate. Slight
improved overall lung aeration.
IMPRESSION: 1. Stable support apparatus.  Interval addition of an NG tube.
2. Persistent edema and atelectasis but no definite pleural
effusions. Overall slight improved lung aeration.
3. Persistent left basilar density, possibly a combination of
atelectasis and infiltrate.
